# Patient Record
Sex: Male | Born: 1960 | Race: White | Hispanic: No | Marital: Married | Smoking: Never smoker
Health system: Southern US, Community
[De-identification: ages and names within clinical notes are randomized; demographics above are authoritative.]

## PROBLEM LIST (undated history)

## (undated) DIAGNOSIS — I10 Essential (primary) hypertension: Secondary | ICD-10-CM

## (undated) DIAGNOSIS — C801 Malignant (primary) neoplasm, unspecified: Secondary | ICD-10-CM

## (undated) DIAGNOSIS — K219 Gastro-esophageal reflux disease without esophagitis: Secondary | ICD-10-CM

## (undated) DIAGNOSIS — H8109 Meniere's disease, unspecified ear: Secondary | ICD-10-CM

## (undated) DIAGNOSIS — E291 Testicular hypofunction: Secondary | ICD-10-CM

## (undated) HISTORY — DX: Meniere's disease, unspecified ear: H81.09

## (undated) HISTORY — DX: Testicular hypofunction: E29.1

## (undated) HISTORY — DX: Malignant (primary) neoplasm, unspecified: C80.1

## (undated) HISTORY — DX: Essential (primary) hypertension: I10

---

## 2010-04-05 HISTORY — PX: OTHER SURGICAL HISTORY: SHX169

## 2011-01-04 ENCOUNTER — Encounter: Payer: Self-pay | Admitting: Urology

## 2011-02-04 ENCOUNTER — Encounter: Payer: Self-pay | Admitting: Urology

## 2011-04-28 ENCOUNTER — Ambulatory Visit: Payer: Self-pay | Admitting: Orthopedic Surgery

## 2012-04-05 HISTORY — PX: MENISCUS REPAIR: SHX5179

## 2013-07-27 ENCOUNTER — Encounter: Payer: Self-pay | Admitting: Urology

## 2013-07-27 LAB — HEMATOCRIT: HCT: 48.5 % (ref 40.0–52.0)

## 2013-07-27 LAB — HEMOGLOBIN: HGB: 16.8 g/dL (ref 13.0–18.0)

## 2013-08-03 ENCOUNTER — Encounter: Payer: Self-pay | Admitting: Urology

## 2014-11-15 ENCOUNTER — Telehealth: Payer: Self-pay | Admitting: Family Medicine

## 2014-11-15 DIAGNOSIS — I10 Essential (primary) hypertension: Secondary | ICD-10-CM | POA: Insufficient documentation

## 2014-11-15 NOTE — Telephone Encounter (Signed)
Pt came in and would like to have refill for benazepril sent to [facility]

## 2014-11-15 NOTE — Telephone Encounter (Signed)
Pt would like a call back when ready at [phone]

## 2014-11-15 NOTE — Telephone Encounter (Signed)
Confirmed with pharmacy patient has refills on current Rx, patient notified. Patient also told he needs to schedule his 6 mo fu

## 2014-12-26 ENCOUNTER — Encounter: Payer: Self-pay | Admitting: Family Medicine

## 2014-12-26 ENCOUNTER — Ambulatory Visit (INDEPENDENT_AMBULATORY_CARE_PROVIDER_SITE_OTHER): Payer: BLUE CROSS/BLUE SHIELD | Admitting: Family Medicine

## 2014-12-26 VITALS — BP 126/77 | HR 70 | Temp 97.9°F | Ht 69.0 in | Wt 186.0 lb

## 2014-12-26 DIAGNOSIS — I10 Essential (primary) hypertension: Secondary | ICD-10-CM

## 2014-12-26 DIAGNOSIS — E291 Testicular hypofunction: Secondary | ICD-10-CM | POA: Diagnosis not present

## 2014-12-26 NOTE — Assessment & Plan Note (Signed)
The current medical regimen is effective;  continue present plan and medications.  

## 2014-12-26 NOTE — Assessment & Plan Note (Signed)
We'll draw labs for patient's visit

## 2014-12-26 NOTE — Progress Notes (Signed)
   BP 126/77 mmHg  Pulse 70  Temp(Src) 97.9 F (36.6 C)  Ht 5\' 9"  (1.753 m)  Wt 186 lb (84.369 kg)  BMI 27.45 kg/m2  SpO2 99%   Subjective:    Patient ID: Jorge Brown, male    DOB: 1960/11/02, 54 y.o.   MRN: [id number]  HPI: Jorge Brown is a 54 y.o. male  Chief Complaint  Patient presents with  . Hypertension   patient recheck hypertension doing well with medications no side effects takes everyday. Blood pressure is been doing good  also getting testosterone pellets every 4-6 months has blood work pending with urology next week so is ready for urology.  Relevant past medical, surgical, family and social history reviewed and updated as indicated. Interim medical history since our last visit reviewed. Allergies and medications reviewed and updated.  Review of Systems  Constitutional: Negative.   Respiratory: Negative.   Cardiovascular: Negative.     Per HPI unless specifically indicated above     Objective:    BP 126/77 mmHg  Pulse 70  Temp(Src) 97.9 F (36.6 C)  Ht 5\' 9"  (1.753 m)  Wt 186 lb (84.369 kg)  BMI 27.45 kg/m2  SpO2 99%  Wt Readings from Last 3 Encounters:  12/26/14 186 lb (84.369 kg)  06/25/14 186 lb (84.369 kg)    Physical Exam  Constitutional: He is oriented to person, place, and time. He appears well-developed and well-nourished. No distress.  HENT:  Head: Normocephalic and atraumatic.  Right Ear: Hearing normal.  Left Ear: Hearing normal.  Nose: Nose normal.  Eyes: Conjunctivae and lids are normal. Right eye exhibits no discharge. Left eye exhibits no discharge. No scleral icterus.  Cardiovascular: Normal rate, regular rhythm and normal heart sounds.   Pulmonary/Chest: Effort normal and breath sounds normal. No respiratory distress.  Musculoskeletal: Normal range of motion.  Neurological: He is alert and oriented to person, place, and time.  Skin: Skin is intact. No rash noted.  Psychiatric: He has a normal mood and affect. His speech  is normal and behavior is normal. Judgment and thought content normal. Cognition and memory are normal.        Assessment & Plan:   Problem List Items Addressed This Visit      Cardiovascular and Mediastinum   Hypertension    The current medical regimen is effective;  continue present plan and medications.       Relevant Orders   Basic metabolic panel   CBC with Differential/Platelet     Endocrine   Testicular hypofunction    We'll draw labs for patient's visit      Relevant Orders   CBC with Differential/Platelet   PSA   Testosterone    Other Visit Diagnoses    Essential hypertension, benign    -  Primary    Relevant Orders    Basic metabolic panel        Follow up plan: Return in about 6 months (around 06/25/2015), or if symptoms worsen or fail to improve, for Physical Exam.

## 2014-12-27 LAB — CBC WITH DIFFERENTIAL/PLATELET
BASOS ABS: 0 10*3/uL (ref 0.0–0.2)
BASOS: 0 %
EOS (ABSOLUTE): 0.1 10*3/uL (ref 0.0–0.4)
Eos: 1 %
Hematocrit: 45.2 % (ref 37.5–51.0)
Hemoglobin: 15.1 g/dL (ref 12.6–17.7)
Immature Grans (Abs): 0 10*3/uL (ref 0.0–0.1)
Immature Granulocytes: 0 %
LYMPHS ABS: 2.1 10*3/uL (ref 0.7–3.1)
Lymphs: 26 %
MCH: 32.7 pg (ref 26.6–33.0)
MCHC: 33.4 g/dL (ref 31.5–35.7)
MCV: 98 fL — ABNORMAL HIGH (ref 79–97)
MONOCYTES: 8 %
Monocytes Absolute: 0.6 10*3/uL (ref 0.1–0.9)
NEUTROS ABS: 5.2 10*3/uL (ref 1.4–7.0)
Neutrophils: 65 %
Platelets: 227 10*3/uL (ref 150–379)
RBC: 4.62 x10E6/uL (ref 4.14–5.80)
RDW: 13.4 % (ref 12.3–15.4)
WBC: 8.1 10*3/uL (ref 3.4–10.8)

## 2014-12-27 LAB — TESTOSTERONE: Testosterone: 250 ng/dL — ABNORMAL LOW (ref 348–1197)

## 2014-12-27 LAB — BASIC METABOLIC PANEL
BUN / CREAT RATIO: 17 (ref 9–20)
BUN: 17 mg/dL (ref 6–24)
CALCIUM: 9.5 mg/dL (ref 8.7–10.2)
CHLORIDE: 103 mmol/L (ref 97–108)
CO2: 25 mmol/L (ref 18–29)
Creatinine, Ser: 1 mg/dL (ref 0.76–1.27)
GFR, EST AFRICAN AMERICAN: 100 mL/min/{1.73_m2} (ref >59)
GFR, EST NON AFRICAN AMERICAN: 86 mL/min/{1.73_m2} (ref >59)
Glucose: 75 mg/dL (ref 65–99)
POTASSIUM: 4.3 mmol/L (ref 3.5–5.2)
Sodium: 142 mmol/L (ref 134–144)

## 2014-12-27 LAB — PSA: Prostate Specific Ag, Serum: 0.6 ng/mL (ref 0.0–4.0)

## 2014-12-30 ENCOUNTER — Encounter: Payer: Self-pay | Admitting: Family Medicine

## 2015-03-13 ENCOUNTER — Encounter: Payer: Self-pay | Admitting: Unknown Physician Specialty

## 2015-03-25 ENCOUNTER — Other Ambulatory Visit: Payer: Self-pay | Admitting: Otolaryngology

## 2015-03-25 DIAGNOSIS — R42 Dizziness and giddiness: Secondary | ICD-10-CM

## 2015-03-26 ENCOUNTER — Telehealth: Payer: Self-pay | Admitting: Family Medicine

## 2015-03-26 NOTE — Telephone Encounter (Signed)
Ok to start [employer]

## 2015-03-26 NOTE — Telephone Encounter (Signed)
Beth from [facility] called stated Dr. Pryor Ochoa would like to start pt on HCTZ because of his meniere's but wants to make sure this is ok with [facility] first. Please call Beth @ [facility] asap. Thanks.

## 2015-03-26 NOTE — Telephone Encounter (Signed)
Called and left msg for [place], [place] from [facility] for patient to start HCTZ

## 2015-04-01 ENCOUNTER — Encounter: Payer: BLUE CROSS/BLUE SHIELD | Admitting: Unknown Physician Specialty

## 2015-04-03 ENCOUNTER — Encounter: Payer: Self-pay | Admitting: Unknown Physician Specialty

## 2015-04-03 ENCOUNTER — Ambulatory Visit (INDEPENDENT_AMBULATORY_CARE_PROVIDER_SITE_OTHER): Payer: Self-pay | Admitting: Unknown Physician Specialty

## 2015-04-03 VITALS — BP 126/85 | HR 73 | Temp 98.4°F | Ht 69.1 in | Wt 194.6 lb

## 2015-04-03 DIAGNOSIS — Z021 Encounter for pre-employment examination: Secondary | ICD-10-CM

## 2015-04-03 DIAGNOSIS — Z0000 Encounter for general adult medical examination without abnormal findings: Secondary | ICD-10-CM

## 2015-04-03 LAB — URINALYSIS, DIPSTICK ONLY
Bilirubin, UA: NEGATIVE
Glucose, UA: NEGATIVE
KETONES UA: NEGATIVE
Leukocytes, UA: NEGATIVE
NITRITE UA: NEGATIVE
Protein, UA: NEGATIVE
RBC, UA: NEGATIVE
Specific Gravity, UA: 1.01 (ref 1.005–1.030)
UUROB: 0.2 mg/dL (ref 0.2–1.0)
pH, UA: 7 (ref 5.0–7.5)

## 2015-04-03 NOTE — Progress Notes (Signed)
BP 126/85 mmHg  Pulse 73  Temp(Src) 98.4 F (36.9 C)  Ht 5' 9.1" (1.755 m)  Wt 194 lb 9.6 oz (88.27 kg)  BMI 28.66 kg/m2  SpO2 97%   Subjective:    Patient ID: Jorge Brown, male    DOB: 01-15-1961, 54 y.o.   MRN: [id number]  HPI: [name] is a 54 y.o. male  Chief Complaint  Patient presents with  . Employment Physical   DOT physical  Relevant past medical, surgical, family and social history reviewed and updated as indicated. Interim medical history since our last visit reviewed. Allergies and medications reviewed and updated.  Review of Systems  Constitutional: Negative.   HENT: Negative.   Eyes: Negative.   Respiratory: Negative.   Cardiovascular: Negative.   Gastrointestinal: Negative.   Endocrine: Negative.   Genitourinary: Negative.   Skin: Negative.   Allergic/Immunologic: Negative.   Neurological: Negative.   Hematological: Negative.   Psychiatric/Behavioral: Negative.     Per HPI unless specifically indicated above     Objective:    BP 126/85 mmHg  Pulse 73  Temp(Src) 98.4 F (36.9 C)  Ht 5' 9.1" (1.755 m)  Wt 194 lb 9.6 oz (88.27 kg)  BMI 28.66 kg/m2  SpO2 97%  Wt Readings from Last 3 Encounters:  04/03/15 194 lb 9.6 oz (88.27 kg)  12/26/14 186 lb (84.369 kg)  06/25/14 186 lb (84.369 kg)    Physical Exam  Constitutional: He is oriented to person, place, and time. He appears well-developed and well-nourished.  HENT:  Head: Normocephalic.  Right Ear: Tympanic membrane, external ear and ear canal normal.  Left Ear: Tympanic membrane, external ear and ear canal normal.  Mouth/Throat: Uvula is midline, oropharynx is clear and moist and mucous membranes are normal.  Eyes: Pupils are equal, round, and reactive to light.  Cardiovascular: Normal rate, regular rhythm and normal heart sounds.  Exam reveals no gallop and no friction rub.   No murmur heard. Pulmonary/Chest: Effort normal and breath sounds normal. No respiratory distress.   Abdominal: Soft. Bowel sounds are normal. He exhibits no distension. There is no tenderness.  Musculoskeletal: Normal range of motion.  Neurological: He is alert and oriented to person, place, and time. He has normal reflexes.  Skin: Skin is warm and dry.  Psychiatric: He has a normal mood and affect. His behavior is normal. Judgment and thought content normal.    Results for orders placed or performed in visit on [id number]  Basic metabolic panel  Result Value Ref Range   Glucose 75 65 - 99 mg/dL   BUN 17 6 - 24 mg/dL   Creatinine, Ser 1.00 0.76 - 1.27 mg/dL   GFR calc non Af Amer 86 >59 mL/min/1.73   GFR calc Af Amer 100 >59 mL/min/1.73   BUN/Creatinine Ratio 17 9 - 20   Sodium 142 134 - 144 mmol/L   Potassium 4.3 3.5 - 5.2 mmol/L   Chloride 103 97 - 108 mmol/L   CO2 25 18 - 29 mmol/L   Calcium 9.5 8.7 - 10.2 mg/dL  CBC with Differential/Platelet  Result Value Ref Range   WBC 8.1 3.4 - 10.8 x10E3/uL   RBC 4.62 4.14 - 5.80 x10E6/uL   Hemoglobin 15.1 12.6 - 17.7 g/dL   Hematocrit 45.2 37.5 - 51.0 %   MCV 98 (H) 79 - 97 fL   MCH 32.7 26.6 - 33.0 pg   MCHC 33.4 31.5 - 35.7 g/dL   RDW 13.4 12.3 - 15.4 %  Platelets 227 150 - 379 x10E3/uL   Neutrophils 65 %   Lymphs 26 %   Monocytes 8 %   Eos 1 %   Basos 0 %   Neutrophils Absolute 5.2 1.4 - 7.0 x10E3/uL   Lymphocytes Absolute 2.1 0.7 - 3.1 x10E3/uL   Monocytes Absolute 0.6 0.1 - 0.9 x10E3/uL   EOS (ABSOLUTE) 0.1 0.0 - 0.4 x10E3/uL   Basophils Absolute 0.0 0.0 - 0.2 x10E3/uL   Immature Granulocytes 0 %   Immature Grans (Abs) 0.0 0.0 - 0.1 x10E3/uL  PSA  Result Value Ref Range   Prostate Specific Ag, Serum 0.6 0.0 - 4.0 ng/mL  Testosterone  Result Value Ref Range   Testosterone 250 (L) 348 - 1197 ng/dL   Comment, Testosterone Comment       Assessment & Plan:   Problem List Items Addressed This Visit    None    Visit Diagnoses    Routine general medical examination at a health care facility    -  Primary     Relevant Orders    Urinalysis, dipstick only        Follow up plan: No Follow-up on file.

## 2015-04-16 ENCOUNTER — Ambulatory Visit: Payer: Self-pay

## 2015-05-05 ENCOUNTER — Ambulatory Visit: Payer: BLUE CROSS/BLUE SHIELD

## 2015-06-11 ENCOUNTER — Other Ambulatory Visit: Payer: Self-pay

## 2015-06-11 MED ORDER — BENAZEPRIL HCL 40 MG PO TABS: 40.0000 mg | ORAL_TABLET | Freq: Every day | ORAL | Status: DC

## 2015-06-13 ENCOUNTER — Telehealth: Payer: Self-pay | Admitting: Family Medicine

## 2015-06-13 NOTE — Telephone Encounter (Signed)
This was done on 06/11/15

## 2015-06-13 NOTE — Telephone Encounter (Signed)
Pt needs refill for benazepril (LOTENSIN) 40 MG tablet sent to [facility]

## 2015-06-26 ENCOUNTER — Encounter: Payer: BLUE CROSS/BLUE SHIELD | Admitting: Family Medicine

## 2015-07-15 ENCOUNTER — Telehealth: Payer: Self-pay | Admitting: Family Medicine

## 2015-07-15 MED ORDER — BENAZEPRIL HCL 40 MG PO TABS: 40.0000 mg | ORAL_TABLET | Freq: Every day | ORAL | Status: DC

## 2015-07-15 NOTE — Telephone Encounter (Signed)
Patient need refill on his medication due to we had to reschedule his medication because Dr. Jeananne Rama was out sick. He needs refill until his next appointment, RX benazepril (LOTENSIN) 40 MG tablet Pharmacy: [facility], [place]

## 2015-08-28 ENCOUNTER — Ambulatory Visit (INDEPENDENT_AMBULATORY_CARE_PROVIDER_SITE_OTHER): Payer: BLUE CROSS/BLUE SHIELD | Admitting: Family Medicine

## 2015-08-28 ENCOUNTER — Encounter: Payer: Self-pay | Admitting: Family Medicine

## 2015-08-28 VITALS — BP 122/83 | HR 80 | Temp 98.0°F | Ht 69.5 in | Wt 183.0 lb

## 2015-08-28 DIAGNOSIS — E291 Testicular hypofunction: Secondary | ICD-10-CM

## 2015-08-28 DIAGNOSIS — I10 Essential (primary) hypertension: Secondary | ICD-10-CM | POA: Diagnosis not present

## 2015-08-28 DIAGNOSIS — Z0000 Encounter for general adult medical examination without abnormal findings: Secondary | ICD-10-CM

## 2015-08-28 LAB — URINALYSIS, ROUTINE W REFLEX MICROSCOPIC
BILIRUBIN UA: NEGATIVE
Glucose, UA: NEGATIVE
Ketones, UA: NEGATIVE
LEUKOCYTES UA: NEGATIVE
NITRITE UA: NEGATIVE
PH UA: 5.5 (ref 5.0–7.5)
Protein, UA: NEGATIVE
RBC UA: NEGATIVE
UUROB: 0.2 mg/dL (ref 0.2–1.0)

## 2015-08-28 MED ORDER — HYDROCHLOROTHIAZIDE 25 MG PO TABS: 25.0000 mg | ORAL_TABLET | Freq: Every day | ORAL | Status: DC

## 2015-08-28 MED ORDER — BENAZEPRIL HCL 40 MG PO TABS: 40.0000 mg | ORAL_TABLET | Freq: Every day | ORAL | Status: DC

## 2015-08-28 NOTE — Progress Notes (Signed)
BP 122/83 mmHg  Pulse 80  Temp(Src) 98 F (36.7 C)  Ht 5' 9.5" (1.765 m)  Wt 183 lb (83.008 kg)  BMI 26.65 kg/m2  SpO2 98%   Subjective:    Patient ID: Jorge Brown, male    DOB: 29-Jan-1961, 55 y.o.   MRN: [id number]  HPI: Jorge Brown is a 55 y.o. male  Chief Complaint  Patient presents with  . Annual Exam  Patient all in all doing well blood pressure doing good no complaints from medications no side effects   Relevant past medical, surgical, family and social history reviewed and updated as indicated. Interim medical history since our last visit reviewed. Allergies and medications reviewed and updated.  Review of Systems  Constitutional: Negative.   HENT: Negative.   Eyes: Negative.   Respiratory: Negative.   Cardiovascular: Negative.   Gastrointestinal: Negative.   Endocrine: Negative.   Genitourinary: Negative.   Musculoskeletal: Negative.   Skin: Negative.   Allergic/Immunologic: Negative.   Neurological: Negative.   Hematological: Negative.   Psychiatric/Behavioral: Negative.     Per HPI unless specifically indicated above     Objective:    BP 122/83 mmHg  Pulse 80  Temp(Src) 98 F (36.7 C)  Ht 5' 9.5" (1.765 m)  Wt 183 lb (83.008 kg)  BMI 26.65 kg/m2  SpO2 98%  Wt Readings from Last 3 Encounters:  08/28/15 183 lb (83.008 kg)  04/03/15 194 lb 9.6 oz (88.27 kg)  12/26/14 186 lb (84.369 kg)    Physical Exam  Constitutional: He is oriented to person, place, and time. He appears well-developed and well-nourished.  HENT:  Head: Normocephalic and atraumatic.  Right Ear: External ear normal.  Left Ear: External ear normal.  Eyes: Conjunctivae and EOM are normal. Pupils are equal, round, and reactive to light.  Neck: Normal range of motion. Neck supple.  Cardiovascular: Normal rate, regular rhythm, normal heart sounds and intact distal pulses.   Pulmonary/Chest: Effort normal and breath sounds normal.  Abdominal: Soft. Bowel sounds are  normal. There is no splenomegaly or hepatomegaly.  Genitourinary: Rectum normal, prostate normal and penis normal.  Musculoskeletal: Normal range of motion.  Neurological: He is alert and oriented to person, place, and time. He has normal reflexes.  Skin: No rash noted. No erythema.  Psychiatric: He has a normal mood and affect. His behavior is normal. Judgment and thought content normal.    Results for orders placed or performed in visit on 04/03/15  Urinalysis, dipstick only  Result Value Ref Range   Specific Gravity, UA 1.010 1.005 - 1.030   pH, UA 7.0 5.0 - 7.5   Color, UA Yellow Yellow   Appearance Ur Clear Clear   Leukocytes, UA Negative Negative   Protein, UA Negative Negative/Trace   Glucose, UA Negative Negative   Ketones, UA Negative Negative   RBC, UA Negative Negative   Bilirubin, UA Negative Negative   Urobilinogen, Ur 0.2 0.2 - 1.0 mg/dL   Nitrite, UA Negative Negative      Assessment & Plan:   Problem List Items Addressed This Visit      Cardiovascular and Mediastinum   Hypertension    The current medical regimen is effective;  continue present plan and medications.       Relevant Medications   benazepril (LOTENSIN) 40 MG tablet   hydrochlorothiazide (HYDRODIURIL) 25 MG tablet     Endocrine   Testicular hypofunction    Followed by urology       Other  Visit Diagnoses    Routine general medical examination at a health care facility    -  Primary    Relevant Orders    CBC with Differential/Platelet    Comprehensive metabolic panel    Lipid Panel w/o Chol/HDL Ratio    PSA    TSH    Urinalysis, Routine w reflex microscopic (not at [facility])    Healthcare maintenance        Relevant Orders    Hepatitis C Antibody        Follow up plan: Return in about 6 months (around 02/28/2016) for BMP.

## 2015-08-28 NOTE — Assessment & Plan Note (Signed)
Followed by urology.   

## 2015-08-28 NOTE — Assessment & Plan Note (Signed)
The current medical regimen is effective;  continue present plan and medications.  

## 2015-08-28 NOTE — Addendum Note (Signed)
Addended by: Wynn Maudlin on: 08/28/2015 03:52 PM   Modules accepted: Miquel Dunn

## 2015-08-29 LAB — COMPREHENSIVE METABOLIC PANEL
ALT: 20 IU/L (ref 0–44)
AST: 17 IU/L (ref 0–40)
Albumin/Globulin Ratio: 1.9 (ref 1.2–2.2)
Albumin: 4.5 g/dL (ref 3.5–5.5)
Alkaline Phosphatase: 61 IU/L (ref 39–117)
BILIRUBIN TOTAL: 0.7 mg/dL (ref 0.0–1.2)
BUN/Creatinine Ratio: 20 (ref 9–20)
BUN: 20 mg/dL (ref 6–24)
CALCIUM: 9.2 mg/dL (ref 8.7–10.2)
CHLORIDE: 98 mmol/L (ref 96–106)
CO2: 23 mmol/L (ref 18–29)
CREATININE: 0.99 mg/dL (ref 0.76–1.27)
GFR calc non Af Amer: 87 mL/min/{1.73_m2} (ref >59)
GFR, EST AFRICAN AMERICAN: 101 mL/min/{1.73_m2} (ref >59)
GLUCOSE: 97 mg/dL (ref 65–99)
Globulin, Total: 2.4 g/dL (ref 1.5–4.5)
Potassium: 4 mmol/L (ref 3.5–5.2)
Sodium: 140 mmol/L (ref 134–144)
TOTAL PROTEIN: 6.9 g/dL (ref 6.0–8.5)

## 2015-08-29 LAB — LIPID PANEL W/O CHOL/HDL RATIO
Cholesterol, Total: 202 mg/dL — ABNORMAL HIGH (ref 100–199)
HDL: 55 mg/dL (ref >39)
LDL CALC: 115 mg/dL — AB (ref 0–99)
Triglycerides: 158 mg/dL — ABNORMAL HIGH (ref 0–149)
VLDL CHOLESTEROL CAL: 32 mg/dL (ref 5–40)

## 2015-08-29 LAB — CBC WITH DIFFERENTIAL/PLATELET
Basophils Absolute: 0 10*3/uL (ref 0.0–0.2)
Basos: 0 %
EOS (ABSOLUTE): 0.1 10*3/uL (ref 0.0–0.4)
Eos: 1 %
HEMOGLOBIN: 15.3 g/dL (ref 12.6–17.7)
Hematocrit: 45.7 % (ref 37.5–51.0)
IMMATURE GRANS (ABS): 0 10*3/uL (ref 0.0–0.1)
IMMATURE GRANULOCYTES: 0 %
LYMPHS: 32 %
Lymphocytes Absolute: 2.4 10*3/uL (ref 0.7–3.1)
MCH: 32.3 pg (ref 26.6–33.0)
MCHC: 33.5 g/dL (ref 31.5–35.7)
MCV: 96 fL (ref 79–97)
MONOCYTES: 7 %
Monocytes Absolute: 0.6 10*3/uL (ref 0.1–0.9)
NEUTROS PCT: 60 %
Neutrophils Absolute: 4.5 10*3/uL (ref 1.4–7.0)
PLATELETS: 260 10*3/uL (ref 150–379)
RBC: 4.74 x10E6/uL (ref 4.14–5.80)
RDW: 13.7 % (ref 12.3–15.4)
WBC: 7.6 10*3/uL (ref 3.4–10.8)

## 2015-08-29 LAB — PSA: Prostate Specific Ag, Serum: 0.6 ng/mL (ref 0.0–4.0)

## 2015-08-29 LAB — HEPATITIS C ANTIBODY: Hep C Virus Ab: <0.1 s/co ratio (ref 0.0–0.9)

## 2015-08-29 LAB — TSH: TSH: 0.848 u[IU]/mL (ref 0.450–4.500)

## 2015-09-02 ENCOUNTER — Encounter: Payer: Self-pay | Admitting: Family Medicine

## 2015-10-17 ENCOUNTER — Ambulatory Visit (INDEPENDENT_AMBULATORY_CARE_PROVIDER_SITE_OTHER): Payer: BLUE CROSS/BLUE SHIELD | Admitting: Family Medicine

## 2015-10-17 ENCOUNTER — Encounter: Payer: Self-pay | Admitting: Family Medicine

## 2015-10-17 VITALS — BP 146/87 | HR 59 | Temp 98.5°F | Wt 192.0 lb

## 2015-10-17 DIAGNOSIS — R197 Diarrhea, unspecified: Secondary | ICD-10-CM

## 2015-10-17 NOTE — Patient Instructions (Signed)
Follow up if no improvement 

## 2015-10-17 NOTE — Progress Notes (Signed)
   BP 146/87 mmHg  Pulse 59  Temp(Src) 98.5 F (36.9 C)  Wt 192 lb (87.091 kg)  SpO2 99%   Subjective:    Patient ID: Jorge Brown, male    DOB: 30-Nov-1960, 55 y.o.   MRN: [id number]  HPI: TAHJAY Jorge Brown is a 55 y.o. male  Chief Complaint  Patient presents with  . Diarrhea    since Brown but worse in the last couple days. Stomach feels bloated. He tried Immodium yesterday and it did not help. No abdominal pain,nausea or vomiting.   Patient presents with 4 day history of diarrhea. Takes HCTZ regularly so stopped taking it this week worrying about dehydration (he works outside in the heat all day). Took immodium several times with no relief. No new foods, no sick contacts. No fever, chills, abdominal pain, N/V.  Has been drinking lots of electrolyte replacements.  BMs have been urgent, loose but not watery and happening about 30 min following eating anything. About 4 episodes per day. Denies blood or mucus in stools.   Relevant past medical, surgical, family and social history reviewed and updated as indicated. Interim medical history since our last visit reviewed. Allergies and medications reviewed and updated.  Review of Systems  Constitutional: Negative.  Negative for fever and chills.  HENT: Negative.   Respiratory: Negative.   Cardiovascular: Negative.   Gastrointestinal: Positive for diarrhea. Negative for nausea, vomiting, abdominal pain and blood in stool.  Genitourinary: Negative.  Negative for dysuria, frequency, hematuria and flank pain.  Musculoskeletal: Negative.   Skin: Negative.   Neurological: Negative.   Psychiatric/Behavioral: Negative.     Per HPI unless specifically indicated above     Objective:    BP 146/87 mmHg  Pulse 59  Temp(Src) 98.5 F (36.9 C)  Wt 192 lb (87.091 kg)  SpO2 99%  Wt Readings from Last 3 Encounters:  10/17/15 192 lb (87.091 kg)  08/28/15 183 lb (83.008 kg)  04/03/15 194 lb 9.6 oz (88.27 kg)    Physical Exam    Constitutional: He is oriented to person, place, and time. He appears well-developed and well-nourished. No distress.  HENT:  Head: Atraumatic.  Eyes: Conjunctivae are normal. No scleral icterus.  Neck: Normal range of motion. Neck supple.  Cardiovascular: Normal rate.   Pulmonary/Chest: Effort normal and breath sounds normal. No respiratory distress.  Abdominal: Soft. Bowel sounds are normal. He exhibits no distension and no mass. There is no tenderness. There is no guarding.  Musculoskeletal: Normal range of motion.  Neurological: He is alert and oriented to person, place, and time.  Skin: Skin is warm and dry.  Psychiatric: He has a normal mood and affect. His behavior is normal.  Nursing note and vitals reviewed.       Assessment & Plan:   Problem List Items Addressed This Visit    None    Visit Diagnoses    Diarrhea, unspecified type    -  Primary    Vitals and exam reassuring, await lab results. Recommended conservative treatment for now with immodium and pepto bismol, good PO fluid intake.     Relevant Orders    Comp Met (CMET)    CBC w/Diff        Follow up plan: No Follow-up on file.

## 2015-10-18 LAB — CBC WITH DIFFERENTIAL/PLATELET
BASOS ABS: 0 10*3/uL (ref 0.0–0.2)
Basos: 0 %
EOS (ABSOLUTE): 0.1 10*3/uL (ref 0.0–0.4)
Eos: 1 %
Hematocrit: 45.3 % (ref 37.5–51.0)
Hemoglobin: 15.4 g/dL (ref 12.6–17.7)
IMMATURE GRANS (ABS): 0 10*3/uL (ref 0.0–0.1)
Immature Granulocytes: 0 %
LYMPHS: 28 %
Lymphocytes Absolute: 2.5 10*3/uL (ref 0.7–3.1)
MCH: 32.6 pg (ref 26.6–33.0)
MCHC: 34 g/dL (ref 31.5–35.7)
MCV: 96 fL (ref 79–97)
MONOS ABS: 0.6 10*3/uL (ref 0.1–0.9)
Monocytes: 7 %
NEUTROS ABS: 5.8 10*3/uL (ref 1.4–7.0)
Neutrophils: 64 %
PLATELETS: 219 10*3/uL (ref 150–379)
RBC: 4.72 x10E6/uL (ref 4.14–5.80)
RDW: 13.3 % (ref 12.3–15.4)
WBC: 8.9 10*3/uL (ref 3.4–10.8)

## 2015-10-18 LAB — COMPREHENSIVE METABOLIC PANEL
ALK PHOS: 58 IU/L (ref 39–117)
ALT: 22 IU/L (ref 0–44)
AST: 18 IU/L (ref 0–40)
Albumin/Globulin Ratio: 1.8 (ref 1.2–2.2)
Albumin: 4.2 g/dL (ref 3.5–5.5)
BILIRUBIN TOTAL: 0.7 mg/dL (ref 0.0–1.2)
BUN/Creatinine Ratio: 21 — ABNORMAL HIGH (ref 9–20)
BUN: 20 mg/dL (ref 6–24)
CO2: 25 mmol/L (ref 18–29)
CREATININE: 0.96 mg/dL (ref 0.76–1.27)
Calcium: 9.6 mg/dL (ref 8.7–10.2)
Chloride: 101 mmol/L (ref 96–106)
GFR calc Af Amer: 104 mL/min/{1.73_m2} (ref >59)
GFR, EST NON AFRICAN AMERICAN: 90 mL/min/{1.73_m2} (ref >59)
GLUCOSE: 87 mg/dL (ref 65–99)
Globulin, Total: 2.4 g/dL (ref 1.5–4.5)
Potassium: 4.2 mmol/L (ref 3.5–5.2)
SODIUM: 142 mmol/L (ref 134–144)
Total Protein: 6.6 g/dL (ref 6.0–8.5)

## 2015-10-20 ENCOUNTER — Encounter: Payer: Self-pay | Admitting: Family Medicine

## 2016-03-02 ENCOUNTER — Ambulatory Visit: Payer: Self-pay | Admitting: Family Medicine

## 2016-03-08 ENCOUNTER — Encounter: Payer: Self-pay | Admitting: Family Medicine

## 2016-03-08 ENCOUNTER — Ambulatory Visit (INDEPENDENT_AMBULATORY_CARE_PROVIDER_SITE_OTHER): Payer: PRIVATE HEALTH INSURANCE | Admitting: Family Medicine

## 2016-03-08 VITALS — BP 136/86 | HR 63 | Temp 97.8°F | Ht 69.8 in | Wt 194.6 lb

## 2016-03-08 DIAGNOSIS — K219 Gastro-esophageal reflux disease without esophagitis: Secondary | ICD-10-CM

## 2016-03-08 DIAGNOSIS — I10 Essential (primary) hypertension: Secondary | ICD-10-CM | POA: Diagnosis not present

## 2016-03-08 MED ORDER — DEXLANSOPRAZOLE 60 MG PO CPDR: 60.0000 mg | DELAYED_RELEASE_CAPSULE | Freq: Every day | ORAL | 11 refills | Status: DC

## 2016-03-08 NOTE — Assessment & Plan Note (Signed)
Trial of dexilant  

## 2016-03-08 NOTE — Progress Notes (Signed)
BP 136/86 (BP Location: Left Arm)   Pulse 63   Temp 97.8 F (36.6 C)   Ht 5' 9.8" (1.773 m)   Wt 194 lb 9.6 oz (88.3 kg)   SpO2 99%   BMI 28.08 kg/m    Subjective:    Patient ID: Jorge Brown, male    DOB: 03-19-1961, 55 y.o.   MRN: [id number]  HPI: Jorge Brown is a 55 y.o. male  Chief Complaint  Patient presents with  . Hypertension   Doing well with blood pressure no complaints taking medications faithfully Has also been taking Prilosec hasn't doing as well for reflux wants something more Relevant past medical, surgical, family and social history reviewed and updated as indicated. Interim medical history since our last visit reviewed. Allergies and medications reviewed and updated.  Review of Systems  Constitutional: Negative.   Respiratory: Negative.   Cardiovascular: Negative.     Per HPI unless specifically indicated above     Objective:    BP 136/86 (BP Location: Left Arm)   Pulse 63   Temp 97.8 F (36.6 C)   Ht 5' 9.8" (1.773 m)   Wt 194 lb 9.6 oz (88.3 kg)   SpO2 99%   BMI 28.08 kg/m   Wt Readings from Last 3 Encounters:  03/08/16 194 lb 9.6 oz (88.3 kg)  10/17/15 192 lb (87.1 kg)  08/28/15 183 lb (83 kg)    Physical Exam  Constitutional: He is oriented to person, place, and time. He appears well-developed and well-nourished. No distress.  HENT:  Head: Normocephalic and atraumatic.  Right Ear: Hearing normal.  Left Ear: Hearing normal.  Nose: Nose normal.  Eyes: Conjunctivae and lids are normal. Right eye exhibits no discharge. Left eye exhibits no discharge. No scleral icterus.  Cardiovascular: Normal rate, regular rhythm and normal heart sounds.   Pulmonary/Chest: Effort normal and breath sounds normal. No respiratory distress.  Musculoskeletal: Normal range of motion.  Neurological: He is alert and oriented to person, place, and time.  Skin: Skin is intact. No rash noted.  Psychiatric: He has a normal mood and affect. His speech is  normal and behavior is normal. Judgment and thought content normal. Cognition and memory are normal.    Results for orders placed or performed in visit on 10/17/15  Comp Met (CMET)  Result Value Ref Range   Glucose 87 65 - 99 mg/dL   BUN 20 6 - 24 mg/dL   Creatinine, Ser 0.96 0.76 - 1.27 mg/dL   GFR calc non Af Amer 90 >59 mL/min/1.73   GFR calc Af Amer 104 >59 mL/min/1.73   BUN/Creatinine Ratio 21 (H) 9 - 20   Sodium 142 134 - 144 mmol/L   Potassium 4.2 3.5 - 5.2 mmol/L   Chloride 101 96 - 106 mmol/L   CO2 25 18 - 29 mmol/L   Calcium 9.6 8.7 - 10.2 mg/dL   Total Protein 6.6 6.0 - 8.5 g/dL   Albumin 4.2 3.5 - 5.5 g/dL   Globulin, Total 2.4 1.5 - 4.5 g/dL   Albumin/Globulin Ratio 1.8 1.2 - 2.2   Bilirubin Total 0.7 0.0 - 1.2 mg/dL   Alkaline Phosphatase 58 39 - 117 IU/L   AST 18 0 - 40 IU/L   ALT 22 0 - 44 IU/L  CBC w/Diff  Result Value Ref Range   WBC 8.9 3.4 - 10.8 x10E3/uL   RBC 4.72 4.14 - 5.80 x10E6/uL   Hemoglobin 15.4 12.6 - 17.7 g/dL   Hematocrit  45.3 37.5 - 51.0 %   MCV 96 79 - 97 fL   MCH 32.6 26.6 - 33.0 pg   MCHC 34.0 31.5 - 35.7 g/dL   RDW 13.3 12.3 - 15.4 %   Platelets 219 150 - 379 x10E3/uL   Neutrophils 64 %   Lymphs 28 %   Monocytes 7 %   Eos 1 %   Basos 0 %   Neutrophils Absolute 5.8 1.4 - 7.0 x10E3/uL   Lymphocytes Absolute 2.5 0.7 - 3.1 x10E3/uL   Monocytes Absolute 0.6 0.1 - 0.9 x10E3/uL   EOS (ABSOLUTE) 0.1 0.0 - 0.4 x10E3/uL   Basophils Absolute 0.0 0.0 - 0.2 x10E3/uL   Immature Granulocytes 0 %   Immature Grans (Abs) 0.0 0.0 - 0.1 x10E3/uL      Assessment & Plan:   Problem List Items Addressed This Visit      Cardiovascular and Mediastinum   Hypertension - Primary    The current medical regimen is effective;  continue present plan and medications.       Relevant Orders   Basic metabolic panel     Digestive   Acid reflux    Trial of dexilant      Relevant Medications   dexlansoprazole (DEXILANT) 60 MG capsule       Follow  up plan: Return in about 6 months (around 09/06/2016) for Physical Exam.

## 2016-03-08 NOTE — Assessment & Plan Note (Signed)
The current medical regimen is effective;  continue present plan and medications.  

## 2016-03-09 ENCOUNTER — Encounter: Payer: Self-pay | Admitting: Family Medicine

## 2016-03-09 LAB — BASIC METABOLIC PANEL
BUN/Creatinine Ratio: 15 (ref 9–20)
BUN: 16 mg/dL (ref 6–24)
CO2: 26 mmol/L (ref 18–29)
CREATININE: 1.07 mg/dL (ref 0.76–1.27)
Calcium: 9.7 mg/dL (ref 8.7–10.2)
Chloride: 97 mmol/L (ref 96–106)
GFR, EST AFRICAN AMERICAN: 91 mL/min/{1.73_m2} (ref >59)
GFR, EST NON AFRICAN AMERICAN: 79 mL/min/{1.73_m2} (ref >59)
Glucose: 84 mg/dL (ref 65–99)
POTASSIUM: 4.2 mmol/L (ref 3.5–5.2)
SODIUM: 139 mmol/L (ref 134–144)

## 2016-03-12 ENCOUNTER — Encounter: Payer: Self-pay | Admitting: Unknown Physician Specialty

## 2016-03-15 ENCOUNTER — Telehealth: Payer: Self-pay

## 2016-03-15 MED ORDER — OMEPRAZOLE 20 MG PO CPDR: 20.0000 mg | DELAYED_RELEASE_CAPSULE | Freq: Every day | ORAL | 4 refills | Status: DC

## 2016-03-15 NOTE — Telephone Encounter (Signed)
Request to change Dexilant 60mg  capsule.   Insurance won't cover it unless patient has tried and failed generic versions (omeprazole, etc)

## 2016-03-22 ENCOUNTER — Ambulatory Visit (INDEPENDENT_AMBULATORY_CARE_PROVIDER_SITE_OTHER): Payer: Self-pay | Admitting: Unknown Physician Specialty

## 2016-03-22 ENCOUNTER — Encounter: Payer: Self-pay | Admitting: Unknown Physician Specialty

## 2016-03-22 VITALS — BP 124/77 | HR 69 | Temp 97.9°F | Ht 69.2 in | Wt 193.8 lb

## 2016-03-22 DIAGNOSIS — Z021 Encounter for pre-employment examination: Secondary | ICD-10-CM

## 2016-03-22 DIAGNOSIS — Z0289 Encounter for other administrative examinations: Secondary | ICD-10-CM

## 2016-03-22 LAB — URINALYSIS, DIPSTICK ONLY
Bilirubin, UA: NEGATIVE
Glucose, UA: NEGATIVE
Ketones, UA: NEGATIVE
LEUKOCYTES UA: NEGATIVE
NITRITE UA: NEGATIVE
PH UA: 7 (ref 5.0–7.5)
Protein, UA: NEGATIVE
RBC, UA: NEGATIVE
Specific Gravity, UA: 1.01 (ref 1.005–1.030)
UUROB: 0.2 mg/dL (ref 0.2–1.0)

## 2016-03-22 NOTE — Progress Notes (Signed)
   BP 124/77 (BP Location: Left Arm, Patient Position: Sitting, Cuff Size: Large)   Pulse 69   Temp 97.9 F (36.6 C)   Ht 5' 9.2" (1.758 m)   Wt 193 lb 12.8 oz (87.9 kg)   SpO2 97%   BMI 28.45 kg/m    Subjective:    Patient ID: Jorge Brown, male    DOB: 1961-03-04, 55 y.o.   MRN: [id number]  HPI: Jorge Brown is a 55 y.o. male  Chief Complaint  Patient presents with  . DOT Physical    Relevant past medical, surgical, family and social history reviewed and updated as indicated. Interim medical history since our last visit reviewed. Allergies and medications reviewed and updated.  Review of Systems  Per HPI unless specifically indicated above     Objective:    BP 124/77 (BP Location: Left Arm, Patient Position: Sitting, Cuff Size: Large)   Pulse 69   Temp 97.9 F (36.6 C)   Ht 5' 9.2" (1.758 m)   Wt 193 lb 12.8 oz (87.9 kg)   SpO2 97%   BMI 28.45 kg/m   Wt Readings from Last 3 Encounters:  03/22/16 193 lb 12.8 oz (87.9 kg)  03/08/16 194 lb 9.6 oz (88.3 kg)  10/17/15 192 lb (87.1 kg)    Physical Exam  Results for orders placed or performed in visit on [id number]  Basic metabolic panel  Result Value Ref Range   Glucose 84 65 - 99 mg/dL   BUN 16 6 - 24 mg/dL   Creatinine, Ser 1.07 0.76 - 1.27 mg/dL   GFR calc non Af Amer 79 >59 mL/min/1.73   GFR calc Af Amer 91 >59 mL/min/1.73   BUN/Creatinine Ratio 15 9 - 20   Sodium 139 134 - 144 mmol/L   Potassium 4.2 3.5 - 5.2 mmol/L   Chloride 97 96 - 106 mmol/L   CO2 26 18 - 29 mmol/L   Calcium 9.7 8.7 - 10.2 mg/dL      Assessment & Plan:   Problem List Items Addressed This Visit    None    Visit Diagnoses    Encounter for physical examination related to employment    -  Primary   Relevant Orders   Urinalysis, dipstick only       Follow up plan: Return in about 1 year (around 03/22/2017).   DOT.  See from

## 2016-03-31 ENCOUNTER — Telehealth: Payer: Self-pay | Admitting: Family Medicine

## 2016-03-31 NOTE — Telephone Encounter (Signed)
Pt called stated he had issues with the last medication Dr. Jeananne Rama prescribed for acid reflux. Wants to know if something different can be called in. Please call pt to follow up. Pharm is [facility] in [place]. Thanks.

## 2016-03-31 NOTE — Telephone Encounter (Signed)
Routing to provider  

## 2016-04-02 MED ORDER — RANITIDINE HCL 150 MG PO CAPS: 150.0000 mg | ORAL_CAPSULE | Freq: Two times a day (BID) | ORAL | 1 refills | Status: DC

## 2016-04-02 NOTE — Telephone Encounter (Signed)
New Rx sent to his pharmacy. If he has trouble with this one, will need follow up to discuss changing medicine again.

## 2016-04-02 NOTE — Telephone Encounter (Signed)
Left message on patient's voicemail.

## 2016-04-26 ENCOUNTER — Telehealth: Payer: Self-pay | Admitting: Unknown Physician Specialty

## 2016-04-26 NOTE — Telephone Encounter (Signed)
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 Will call and let patient and his wife know when forms have been successfully faxed.

## 2016-04-26 NOTE — Telephone Encounter (Signed)
Spoke with Alwyn Ren about this DOT. I have tried to fax this DOT to the number provided and it failed so I am trying to send it again now. On the patient's medical card, we need the patient's signature. I called and left the patient's wife a message (DPR signed and can leave detailed message) letting her know everything that was going on. I also let her know that we needed her husband's signature on his DOT card and that I would call and let him know this as well. Will call patient's wife back once form has been successfully sent to [facility].

## 2016-04-27 NOTE — Telephone Encounter (Signed)
Patient also notified that DOT forms were successfully faxed.

## 2016-04-27 NOTE — Telephone Encounter (Signed)
DOT forms successfully faxed to [facility] office. Called and left patient's wife a VM letting her know that they went through.

## 2016-05-05 ENCOUNTER — Ambulatory Visit
Admission: RE | Admit: 2016-05-05 | Discharge: 2016-05-05 | Disposition: A | Discharge status: Home / Self Care | Payer: No Typology Code available for payment source | Source: Ambulatory Visit | Attending: Urology | Admitting: Urology

## 2016-05-19 ENCOUNTER — Ambulatory Visit
Admission: RE | Admit: 2016-05-19 | Discharge: 2016-05-19 | Disposition: A | Discharge status: Home / Self Care | Payer: No Typology Code available for payment source | Source: Ambulatory Visit | Attending: Urology | Admitting: Urology

## 2016-05-19 DIAGNOSIS — D751 Secondary polycythemia: Secondary | ICD-10-CM | POA: Insufficient documentation

## 2016-05-19 LAB — HEMOGLOBIN AND HEMATOCRIT, BLOOD
HCT: 50.2 % (ref 40.0–52.0)
Hemoglobin: 17.5 g/dL (ref 13.0–18.0)

## 2016-05-26 ENCOUNTER — Other Ambulatory Visit: Payer: Self-pay | Admitting: Family Medicine

## 2016-05-27 ENCOUNTER — Ambulatory Visit (INDEPENDENT_AMBULATORY_CARE_PROVIDER_SITE_OTHER): Payer: PRIVATE HEALTH INSURANCE | Admitting: Family Medicine

## 2016-05-27 ENCOUNTER — Encounter: Payer: Self-pay | Admitting: Family Medicine

## 2016-05-27 VITALS — BP 110/78 | HR 83 | Ht 69.0 in | Wt 194.5 lb

## 2016-05-27 DIAGNOSIS — Z1211 Encounter for screening for malignant neoplasm of colon: Secondary | ICD-10-CM

## 2016-05-27 DIAGNOSIS — I10 Essential (primary) hypertension: Secondary | ICD-10-CM | POA: Diagnosis not present

## 2016-05-27 DIAGNOSIS — K219 Gastro-esophageal reflux disease without esophagitis: Secondary | ICD-10-CM | POA: Diagnosis not present

## 2016-05-27 MED ORDER — ESOMEPRAZOLE MAGNESIUM 40 MG PO CPDR: 40.0000 mg | DELAYED_RELEASE_CAPSULE | Freq: Every day | ORAL | 3 refills | Status: DC

## 2016-05-27 NOTE — Progress Notes (Signed)
   BP 110/78   Pulse 83   Ht 5\' 9"  (1.753 m)   Wt 194 lb 8 oz (88.2 kg)   SpO2 96%   BMI 28.72 kg/m    Subjective:    Patient ID: Jorge Brown, male    DOB: 1961/04/02, 56 y.o.   MRN: [id number]  HPI: Jorge Brown is a 56 y.o. male  Chief Complaint  Patient presents with  . Gastroesophageal Reflux  Patient with worsening reflux symptoms tried Prilosec and helped a little bit with reflux but was so bloated with gas was too uncomfortable to continue taking. Has been taking over-the-counter Zantac which helps a little bit but still having a lot of symptoms. Has throat clearing acid hot throat symptoms no real coughing at night. His bed is not propped up.  Relevant past medical, surgical, family and social history reviewed and updated as indicated. Interim medical history since our last visit reviewed. Allergies and medications reviewed and updated.  Review of Systems  Constitutional: Negative.   Respiratory: Negative.   Cardiovascular: Negative.     Per HPI unless specifically indicated above     Objective:    BP 110/78   Pulse 83   Ht 5\' 9"  (1.753 m)   Wt 194 lb 8 oz (88.2 kg)   SpO2 96%   BMI 28.72 kg/m   Wt Readings from Last 3 Encounters:  05/27/16 194 lb 8 oz (88.2 kg)  03/22/16 193 lb 12.8 oz (87.9 kg)  03/08/16 194 lb 9.6 oz (88.3 kg)    Physical Exam  Constitutional: He is oriented to person, place, and time. He appears well-developed and well-nourished.  HENT:  Head: Normocephalic and atraumatic.  Eyes: Conjunctivae and EOM are normal.  Neck: Normal range of motion.  Cardiovascular: Normal rate, regular rhythm and normal heart sounds.   Pulmonary/Chest: Effort normal and breath sounds normal.  Abdominal: Soft. Bowel sounds are normal. He exhibits no distension and no mass. There is no tenderness. There is no rebound and no guarding.  Musculoskeletal: Normal range of motion.  Neurological: He is alert and oriented to person, place, and time.  Skin: No  erythema.  Psychiatric: He has a normal mood and affect. His behavior is normal. Judgment and thought content normal.    Results for orders placed or performed during the hospital encounter of 05/19/16  Hemoglobin and hematocrit, blood  Result Value Ref Range   Hemoglobin 17.5 13.0 - 18.0 g/dL   HCT 50.2 40.0 - 52.0 %      Assessment & Plan:   Problem List Items Addressed This Visit      Cardiovascular and Mediastinum   Hypertension - Primary    The current medical regimen is effective;  continue present plan and medications.       Relevant Orders   H. pylori antibody, IgA     Digestive   Acid reflux    Discuss reflux with patient Will do trial of Nexium and/or dexalant GI referral for evaluation of worsening reflux. Discuss dietary measures and propping up his bed.      Relevant Medications   esomeprazole (NEXIUM) 40 MG capsule   Other Relevant Orders   Ambulatory referral to Gastroenterology    Other Visit Diagnoses    Colon cancer screening       Relevant Orders   Cologuard       Follow up plan: Return for Physical Exam.

## 2016-05-27 NOTE — Assessment & Plan Note (Signed)
The current medical regimen is effective;  continue present plan and medications.  

## 2016-05-27 NOTE — Assessment & Plan Note (Signed)
Discuss reflux with patient Will do trial of Nexium and/or dexalant GI referral for evaluation of worsening reflux. Discuss dietary measures and propping up his bed.

## 2016-07-01 ENCOUNTER — Ambulatory Visit: Payer: PRIVATE HEALTH INSURANCE | Admitting: Gastroenterology

## 2016-07-01 ENCOUNTER — Ambulatory Visit (INDEPENDENT_AMBULATORY_CARE_PROVIDER_SITE_OTHER): Payer: No Typology Code available for payment source | Admitting: Gastroenterology

## 2016-07-01 ENCOUNTER — Encounter: Payer: Self-pay | Admitting: Gastroenterology

## 2016-07-01 ENCOUNTER — Other Ambulatory Visit: Payer: Self-pay

## 2016-07-01 VITALS — BP 141/93 | HR 70 | Temp 98.3°F | Ht 69.0 in | Wt 193.0 lb

## 2016-07-01 DIAGNOSIS — K219 Gastro-esophageal reflux disease without esophagitis: Secondary | ICD-10-CM

## 2016-07-01 DIAGNOSIS — Z1211 Encounter for screening for malignant neoplasm of colon: Secondary | ICD-10-CM

## 2016-07-01 NOTE — Progress Notes (Signed)
Gastroenterology Consultation  Referring Provider:     Guadalupe Maple, MD Primary Care Physician:  Golden Pop, MD Primary Gastroenterologist:  Dr. Allen Norris     Reason for Consultation:     GERD        HPI:   Jorge Brown is a 56 y.o. y/o male referred for consultation & management of GERD by Dr. Golden Pop, MD.  He comes in today with a report of having a severe burning in his mouth. The patient took some over-the-counter Prilosec and states that his symptoms were slightly better but was given a prescription of Prilosec and reports that his symptoms got much worse with a lot of abdominal bloating and discomfort. The patient also reports that he was put on another medication for his heartburn and believes it was Zantac. The patient states that when he took the Zantac his symptoms did not get any better. The patient stopped all his medications and he reports that he has not had any symptoms at the present time. The patient did report that he had dysphagia when the heartburn was the worst. There is no report of any unexplained weight loss, fevers, chills, nausea or vomiting. The patient also denies any family history of colon cancer colon polyps.  Past Medical History:  Diagnosis Date  . Cancer ([place])    skin  . Hypertension   . Meniere disease   . Testicular hypofunction     Past Surgical History:  Procedure Laterality Date  . implanted testosterone pellets  2012  . MENISCUS REPAIR Right 2014    Prior to Admission medications   Medication Sig Start Date End Date Taking? Authorizing Provider  benazepril (LOTENSIN) 40 MG tablet Take 1 tablet (40 mg total) by mouth daily. 08/28/15  Yes Guadalupe Maple, MD  hydrochlorothiazide (HYDRODIURIL) 25 MG tablet Take 1 tablet (25 mg total) by mouth daily. 08/28/15  Yes Guadalupe Maple, MD  esomeprazole (NEXIUM) 40 MG capsule Take 1 capsule (40 mg total) by mouth daily. Patient not taking: Reported on 07/01/2016 05/27/16   Guadalupe Maple, MD      Family History  Problem Relation Age of Onset  . Dementia Mother   . Heart disease Maternal Grandfather      Social History  Substance Use Topics  . Smoking status: Never Smoker  . Smokeless tobacco: Never Used  . Alcohol use 2.4 - 3.0 oz/week    4 - 5 Cans of beer per week     Comment: occasional glass of wine    Allergies as of 07/01/2016  . (No Known Allergies)    Review of Systems:    All systems reviewed and negative except where noted in HPI.   Physical Exam:  BP (!) 141/93   Pulse 70   Temp 98.3 F (36.8 C) (Oral)   Ht 5\' 9"  (1.753 m)   Wt 193 lb (87.5 kg)   BMI 28.50 kg/m  No LMP for male patient. Psych:  Alert and cooperative. Normal mood and affect. General:   Alert,  Well-developed, well-nourished, pleasant and cooperative in NAD Head:  Normocephalic and atraumatic. Eyes:  Sclera clear, no icterus.   Conjunctiva pink. Ears:  Normal auditory acuity. Nose:  No deformity, discharge, or lesions. Mouth:  No deformity or lesions,oropharynx pink & moist. Neck:  Supple; no masses or thyromegaly. Lungs:  Respirations even and unlabored.  Clear throughout to auscultation.   No wheezes, crackles, or rhonchi. No acute distress. Heart:  Regular rate  and rhythm; no murmurs, clicks, rubs, or gallops. Abdomen:  Normal bowel sounds.  No bruits.  Soft, non-tender and non-distended without masses, hepatosplenomegaly or hernias noted.  No guarding or rebound tenderness.  Negative Carnett sign.   Rectal:  Deferred.  Msk:  Symmetrical without gross deformities.  Good, equal movement & strength bilaterally. Pulses:  Normal pulses noted. Extremities:  No clubbing or edema.  No cyanosis. Neurologic:  Alert and oriented x3;  grossly normal neurologically. Skin:  Intact without significant lesions or rashes.  No jaundice. Lymph Nodes:  No significant cervical adenopathy. Psych:  Alert and cooperative. Normal mood and affect.  Imaging Studies: No results found.  Assessment  and Plan:   Jorge Brown is a 56 y.o. y/o male who comes in with a history of some dysphagia and GERD that has since resolved. The patient did not have any overt heartburn. The patient will be set up for an EGD to evaluate him for a hiatal hernia versus Barrett's esophagus. The patient will also be set up for a screening colonoscopy and she is never had a colonoscopy. I have discussed risks & benefits which include, but are not limited to, bleeding, infection, perforation & drug reaction.  The patient agrees with this plan & written consent will be obtained.       Lucilla Lame, MD. Marval Regal   Note: This dictation was prepared with Dragon dictation along with smaller phrase technology. Any transcriptional errors that result from this process are unintentional.

## 2016-07-21 ENCOUNTER — Encounter: Payer: Self-pay | Admitting: *Deleted

## 2016-07-21 ENCOUNTER — Telehealth: Payer: Self-pay | Admitting: Gastroenterology

## 2016-07-21 ENCOUNTER — Other Ambulatory Visit: Payer: Self-pay

## 2016-07-21 MED ORDER — NA SULFATE-K SULFATE-MG SULF 17.5-3.13-1.6 GM/177ML PO SOLN: 1.0000 | ORAL | 0 refills | Status: DC

## 2016-07-21 NOTE — Telephone Encounter (Signed)
Suprep called into pt's pharmacy per his request.

## 2016-07-21 NOTE — Telephone Encounter (Signed)
Please call in prep for tomorrow to [facility] in [place]

## 2016-07-22 NOTE — Discharge Instructions (Signed)

## 2016-07-23 ENCOUNTER — Ambulatory Visit: Payer: No Typology Code available for payment source | Admitting: Anesthesiology

## 2016-07-23 ENCOUNTER — Ambulatory Visit
Admission: RE | Admit: 2016-07-23 | Discharge: 2016-07-23 | Disposition: A | Discharge status: Home / Self Care | Payer: No Typology Code available for payment source | Source: Ambulatory Visit | Attending: Gastroenterology | Admitting: Gastroenterology

## 2016-07-23 ENCOUNTER — Telehealth: Payer: Self-pay | Admitting: Gastroenterology

## 2016-07-23 ENCOUNTER — Encounter
Admission: RE | Disposition: A | Discharge status: Home / Self Care | Payer: Self-pay | Source: Ambulatory Visit | Attending: Gastroenterology

## 2016-07-23 DIAGNOSIS — K259 Gastric ulcer, unspecified as acute or chronic, without hemorrhage or perforation: Secondary | ICD-10-CM | POA: Insufficient documentation

## 2016-07-23 DIAGNOSIS — Z1211 Encounter for screening for malignant neoplasm of colon: Secondary | ICD-10-CM

## 2016-07-23 DIAGNOSIS — E291 Testicular hypofunction: Secondary | ICD-10-CM | POA: Insufficient documentation

## 2016-07-23 DIAGNOSIS — Z8249 Family history of ischemic heart disease and other diseases of the circulatory system: Secondary | ICD-10-CM | POA: Diagnosis not present

## 2016-07-23 DIAGNOSIS — R12 Heartburn: Secondary | ICD-10-CM | POA: Diagnosis not present

## 2016-07-23 DIAGNOSIS — Z820 Family history of epilepsy and other diseases of the nervous system: Secondary | ICD-10-CM | POA: Insufficient documentation

## 2016-07-23 DIAGNOSIS — D122 Benign neoplasm of ascending colon: Secondary | ICD-10-CM | POA: Diagnosis not present

## 2016-07-23 DIAGNOSIS — K219 Gastro-esophageal reflux disease without esophagitis: Secondary | ICD-10-CM | POA: Insufficient documentation

## 2016-07-23 DIAGNOSIS — Z85828 Personal history of other malignant neoplasm of skin: Secondary | ICD-10-CM | POA: Diagnosis not present

## 2016-07-23 DIAGNOSIS — D125 Benign neoplasm of sigmoid colon: Secondary | ICD-10-CM

## 2016-07-23 DIAGNOSIS — K2950 Unspecified chronic gastritis without bleeding: Secondary | ICD-10-CM | POA: Insufficient documentation

## 2016-07-23 DIAGNOSIS — H8109 Meniere's disease, unspecified ear: Secondary | ICD-10-CM | POA: Insufficient documentation

## 2016-07-23 DIAGNOSIS — I10 Essential (primary) hypertension: Secondary | ICD-10-CM | POA: Diagnosis not present

## 2016-07-23 DIAGNOSIS — Z79899 Other long term (current) drug therapy: Secondary | ICD-10-CM | POA: Diagnosis not present

## 2016-07-23 DIAGNOSIS — K635 Polyp of colon: Secondary | ICD-10-CM

## 2016-07-23 DIAGNOSIS — D120 Benign neoplasm of cecum: Secondary | ICD-10-CM

## 2016-07-23 DIAGNOSIS — K253 Acute gastric ulcer without hemorrhage or perforation: Secondary | ICD-10-CM | POA: Diagnosis not present

## 2016-07-23 DIAGNOSIS — K641 Second degree hemorrhoids: Secondary | ICD-10-CM | POA: Diagnosis not present

## 2016-07-23 HISTORY — PX: COLONOSCOPY WITH PROPOFOL: SHX5780

## 2016-07-23 HISTORY — PX: POLYPECTOMY: SHX5525

## 2016-07-23 HISTORY — DX: Gastro-esophageal reflux disease without esophagitis: K21.9

## 2016-07-23 HISTORY — PX: ESOPHAGOGASTRODUODENOSCOPY (EGD) WITH PROPOFOL: SHX5813

## 2016-07-23 SURGERY — COLONOSCOPY WITH PROPOFOL
Anesthesia: Monitor Anesthesia Care | Wound class: Contaminated

## 2016-07-23 MED ORDER — GLYCOPYRROLATE 0.2 MG/ML IJ SOLN
INTRAMUSCULAR | Status: DC | PRN
  Administered 2016-07-23: 0.2 mg via INTRAVENOUS

## 2016-07-23 MED ORDER — STERILE WATER FOR IRRIGATION IR SOLN
Status: DC | PRN
  Administered 2016-07-23: 09:00:00

## 2016-07-23 MED ORDER — LIDOCAINE HCL (CARDIAC) 20 MG/ML IV SOLN
INTRAVENOUS | Status: DC | PRN
  Administered 2016-07-23: 50 mg via INTRAVENOUS

## 2016-07-23 MED ORDER — PROPOFOL 10 MG/ML IV BOLUS
INTRAVENOUS | Status: DC | PRN
  Administered 2016-07-23: 50 mg via INTRAVENOUS
  Administered 2016-07-23: 100 mg via INTRAVENOUS
  Administered 2016-07-23: 20 mg via INTRAVENOUS
  Administered 2016-07-23: 50 mg via INTRAVENOUS
  Administered 2016-07-23: 20 mg via INTRAVENOUS
  Administered 2016-07-23: 50 mg via INTRAVENOUS
  Administered 2016-07-23: 20 mg via INTRAVENOUS
  Administered 2016-07-23: 50 mg via INTRAVENOUS

## 2016-07-23 MED ORDER — SODIUM CHLORIDE 0.9 % IV SOLN: INTRAVENOUS | Status: DC

## 2016-07-23 MED ORDER — LACTATED RINGERS IV SOLN
INTRAVENOUS | Status: DC
  Administered 2016-07-23: 08:00:00 via INTRAVENOUS

## 2016-07-23 SURGICAL SUPPLY — 35 items
BALLN DILATOR 10-12 8 (BALLOONS)
BALLN DILATOR 12-15 8 (BALLOONS)
BALLN DILATOR 15-18 8 (BALLOONS)
BALLN DILATOR CRE 0-12 8 (BALLOONS)
BALLN DILATOR ESOPH 8 10 CRE (MISCELLANEOUS) IMPLANT
BALLOON DILATOR 12-15 8 (BALLOONS) IMPLANT
BALLOON DILATOR 15-18 8 (BALLOONS) IMPLANT
BALLOON DILATOR CRE 0-12 8 (BALLOONS) IMPLANT
BLOCK BITE 60FR ADLT L/F GRN (MISCELLANEOUS) ×4 IMPLANT
CANISTER SUCT 1200ML W/VALVE (MISCELLANEOUS) ×4 IMPLANT
CLIP HMST 235XBRD CATH ROT (MISCELLANEOUS) IMPLANT
CLIP RESOLUTION 360 11X235 (MISCELLANEOUS)
FCP ESCP3.2XJMB 240X2.8X (MISCELLANEOUS)
FORCEPS BIOP RAD 4 LRG CAP 4 (CUTTING FORCEPS) ×4 IMPLANT
FORCEPS BIOP RJ4 240 W/NDL (MISCELLANEOUS)
FORCEPS ESCP3.2XJMB 240X2.8X (MISCELLANEOUS) IMPLANT
GOWN CVR UNV OPN BCK APRN NK (MISCELLANEOUS) ×4 IMPLANT
GOWN ISOL THUMB LOOP REG UNIV (MISCELLANEOUS) ×4
INJECTOR VARIJECT VIN23 (MISCELLANEOUS) IMPLANT
KIT DEFENDO VALVE AND CONN (KITS) IMPLANT
KIT ENDO PROCEDURE OLY (KITS) ×4 IMPLANT
MARKER SPOT ENDO TATTOO 5ML (MISCELLANEOUS) IMPLANT
PAD GROUND ADULT SPLIT (MISCELLANEOUS) IMPLANT
PROBE APC STR FIRE (PROBE) IMPLANT
RETRIEVER NET PLAT FOOD (MISCELLANEOUS) IMPLANT
RETRIEVER NET ROTH 2.5X230 LF (MISCELLANEOUS) IMPLANT
SNARE SHORT THROW 13M SML OVAL (MISCELLANEOUS) ×4 IMPLANT
SNARE SHORT THROW 30M LRG OVAL (MISCELLANEOUS) IMPLANT
SNARE SNG USE RND 15MM (INSTRUMENTS) IMPLANT
SPOT EX ENDOSCOPIC TATTOO (MISCELLANEOUS)
SYR INFLATION 60ML (SYRINGE) IMPLANT
TRAP ETRAP POLY (MISCELLANEOUS) ×4 IMPLANT
VARIJECT INJECTOR VIN23 (MISCELLANEOUS)
WATER STERILE IRR 250ML POUR (IV SOLUTION) ×4 IMPLANT
WIRE CRE 18-20MM 8CM F G (MISCELLANEOUS) IMPLANT

## 2016-07-23 NOTE — Telephone Encounter (Signed)
07/23/16 Fax received from [facility] with Authorization # [id number] for EGD & Colonoscopy Z12.11 & K21.9

## 2016-07-23 NOTE — Anesthesia Postprocedure Evaluation (Signed)
Anesthesia Post Note  Patient: [name]  Procedure(s) Performed: Procedure(s) (LRB): COLONOSCOPY WITH PROPOFOL (N/A) ESOPHAGOGASTRODUODENOSCOPY (EGD) WITH PROPOFOL (N/A) POLYPECTOMY  Patient location during evaluation: PACU Anesthesia Type: MAC Level of consciousness: awake and alert Pain management: pain level controlled Vital Signs Assessment: post-procedure vital signs reviewed and stable Respiratory status: spontaneous breathing, nonlabored ventilation and respiratory function stable Cardiovascular status: blood pressure returned to baseline and stable Postop Assessment: no signs of nausea or vomiting Anesthetic complications: no    Veda Canning

## 2016-07-23 NOTE — Op Note (Signed)
[facility] Gastroenterology Patient Name: Jorge Brown Procedure Date: 07/23/2016 8:59 AM MRN: [id number] Account #: [id number] Date of Birth: April 03, 1961 Admit Type: Outpatient Age: 56 Room: [facility] OR ROOM 01 Gender: Male Note Status: Finalized Procedure:            Upper GI endoscopy Indications:          Heartburn Providers:            Lucilla Lame MD, MD Referring MD:         Guadalupe Maple, MD (Referring MD) Medicines:            Propofol per Anesthesia Complications:        No immediate complications. Procedure:            Pre-Anesthesia Assessment:                       - Prior to the procedure, a History and Physical was                        performed, and patient medications and allergies were                        reviewed. The patient's tolerance of previous                        anesthesia was also reviewed. The risks and benefits of                        the procedure and the sedation options and risks were                        discussed with the patient. All questions were                        answered, and informed consent was obtained. Prior                        Anticoagulants: The patient has taken no previous                        anticoagulant or antiplatelet agents. ASA Grade                        Assessment: II - A patient with mild systemic disease.                        After reviewing the risks and benefits, the patient was                        deemed in satisfactory condition to undergo the                        procedure.                       After obtaining informed consent, the endoscope was                        passed under direct vision. Throughout the procedure,  the patient's blood pressure, pulse, and oxygen                        saturations were monitored continuously. The Olympus                        [id number]-[id number] Endoscope (S#. [id number]) was introduced                        through the  mouth, and advanced to the second part of                        duodenum. The upper GI endoscopy was accomplished                        without difficulty. The patient tolerated the procedure                        well. Findings:      The examined esophagus was normal.      Few non-bleeding linear gastric ulcers with pigmented material were       found in the gastric body. Biopsies were taken with a cold forceps for       histology.      The examined duodenum was normal. Impression:           - Normal esophagus.                       - Non-bleeding gastric ulcers with pigmented material.                        Biopsied.                       - Normal examined duodenum. Recommendation:       - Discharge patient to home.                       - Resume previous diet.                       - Continue present medications.                       - Await pathology results.                       - Use a proton pump inhibitor PO daily. Procedure Code(s):    --- Professional ---                       [id number], Esophagogastroduodenoscopy, flexible, transoral;                        with biopsy, single or multiple Diagnosis Code(s):    --- Professional ---                       R12, Heartburn                       K25.9, Gastric ulcer, unspecified as acute or chronic,  without hemorrhage or perforation CPT copyright 2016 American Medical Association. All rights reserved. The codes documented in this report are preliminary and upon coder review may  be revised to meet current compliance requirements. Lucilla Lame MD, MD 07/23/2016 9:09:19 AM This report has been signed electronically. Number of Addenda: 0 Note Initiated On: 07/23/2016 8:59 AM      [facility]

## 2016-07-23 NOTE — Anesthesia Procedure Notes (Signed)
Procedure Name: MAC Performed by: Sabrinia Prien Pre-anesthesia Checklist: Patient identified, Emergency Drugs available, Suction available, Timeout performed and Patient being monitored Patient Re-evaluated:Patient Re-evaluated prior to inductionOxygen Delivery Method: Nasal cannula Placement Confirmation: positive ETCO2     

## 2016-07-23 NOTE — H&P (Signed)
   Lucilla Lame, MD [address] [place], [place] [address] [phone] Fax : [phone]  Primary Care Physician:  Golden Pop, MD Primary Gastroenterologist:  Dr. Allen Norris  Pre-Procedure History & Physical: HPI:  PASCHAL BLANTON is a 56 y.o. male is here for an endoscopy and colonoscopy.   Past Medical History:  Diagnosis Date  . Cancer ([place])    skin  . GERD (gastroesophageal reflux disease)   . Hypertension   . Meniere disease   . Testicular hypofunction     Past Surgical History:  Procedure Laterality Date  . implanted testosterone pellets  2012  . MENISCUS REPAIR Right 2014    Prior to Admission medications   Medication Sig Start Date End Date Taking? Authorizing Provider  benazepril (LOTENSIN) 40 MG tablet Take 1 tablet (40 mg total) by mouth daily. 08/28/15  Yes Guadalupe Maple, MD  hydrochlorothiazide (HYDRODIURIL) 25 MG tablet Take 1 tablet (25 mg total) by mouth daily. 08/28/15  Yes Guadalupe Maple, MD  Na Sulfate-K Sulfate-Mg Sulf (SUPREP BOWEL PREP KIT) 17.5-3.13-1.6 GM/180ML SOLN Take 1 kit by mouth as directed. 07/21/16  Yes Lucilla Lame, MD    Allergies as of 07/01/2016  . (No Known Allergies)    Family History  Problem Relation Age of Onset  . Dementia Mother   . Heart disease Maternal Grandfather     Social History   Social History  . Marital status: Married    Spouse name: N/A  . Number of children: N/A  . Years of education: N/A   Occupational History  . Not on file.   Social History Main Topics  . Smoking status: Never Smoker  . Smokeless tobacco: Never Used  . Alcohol use 4.8 - 5.4 oz/week    7 Glasses of wine, 1 - 2 Cans of beer per week     Comment:    . Drug use: No  . Sexual activity: Yes   Other Topics Concern  . Not on file   Social History Narrative  . No narrative on file    Review of Systems: See HPI, otherwise negative ROS  Physical Exam: BP (!) 144/110   Pulse 87   Temp 98.1 F (36.7 C)  (Temporal)   Resp 16   Ht _0  (1.753 m)   Wt 183 lb (83 kg)   SpO2 100%   BMI 27.02 kg/m  General:   Alert,  pleasant and cooperative in NAD Head:  Normocephalic and atraumatic. Neck:  Supple; no masses or thyromegaly. Lungs:  Clear throughout to auscultation.    Heart:  Regular rate and rhythm. Abdomen:  Soft, nontender and nondistended. Normal bowel sounds, without guarding, and without rebound.   Neurologic:  Alert and  oriented x4;  grossly normal neurologically.  Impression/Plan: [name] is here for an endoscopy and colonoscopy to be performed for GERD and screening  Risks, benefits, limitations, and alternatives regarding  endoscopy and colonoscopy have been reviewed with the patient.  Questions have been answered.  All parties agreeable.   Lucilla Lame, MD  07/23/2016, 8:38 AM

## 2016-07-23 NOTE — Anesthesia Preprocedure Evaluation (Signed)
Anesthesia Evaluation  Patient identified by MRN, date of birth, ID band Patient awake    Reviewed: Allergy & Precautions, NPO status   History of Anesthesia Complications Negative for: history of anesthetic complications  Airway Mallampati: I      Comment: Jaw pops with wide opening Dental  (+) Teeth Intact   Pulmonary neg pulmonary ROS,    breath sounds clear to auscultation       Cardiovascular hypertension,  Rhythm:Regular Rate:Normal     Neuro/Psych    GI/Hepatic GERD  ,  Endo/Other  Testicular hypofunction   Renal/GU      Musculoskeletal   Abdominal   Peds  Hematology   Anesthesia Other Findings   Reproductive/Obstetrics                            Anesthesia Physical Anesthesia Plan  ASA: II  Anesthesia Plan: MAC   Post-op Pain Management:    Induction:   Airway Management Planned: Nasal Cannula  Additional Equipment:   Intra-op Plan:   Post-operative Plan:   Informed Consent: I have reviewed the patients History and Physical, chart, labs and discussed the procedure including the risks, benefits and alternatives for the proposed anesthesia with the patient or authorized representative who has indicated his/her understanding and acceptance.     Plan Discussed with: CRNA  Anesthesia Plan Comments:         Anesthesia Quick Evaluation

## 2016-07-23 NOTE — Op Note (Signed)
[facility] Gastroenterology Patient Name: Jorge Brown Procedure Date: 07/23/2016 8:59 AM MRN: [id number] Account #: [id number] Date of Birth: Nov 30, 1960 Admit Type: Outpatient Age: 56 Room: [facility] OR ROOM 01 Gender: Male Note Status: Finalized Procedure:            Colonoscopy Indications:          Screening for colorectal malignant neoplasm Providers:            Lucilla Lame MD, MD Medicines:            Propofol per Anesthesia Complications:        No immediate complications. Procedure:            Pre-Anesthesia Assessment:                       - Prior to the procedure, a History and Physical was                        performed, and patient medications and allergies were                        reviewed. The patient's tolerance of previous                        anesthesia was also reviewed. The risks and benefits of                        the procedure and the sedation options and risks were                        discussed with the patient. All questions were                        answered, and informed consent was obtained. Prior                        Anticoagulants: The patient has taken no previous                        anticoagulant or antiplatelet agents. ASA Grade                        Assessment: II - A patient with mild systemic disease.                        After reviewing the risks and benefits, the patient was                        deemed in satisfactory condition to undergo the                        procedure.                       After obtaining informed consent, the colonoscope was                        passed under direct vision. Throughout the procedure,                        the patient's blood pressure,  pulse, and oxygen                        saturations were monitored continuously. The [place] [id number]) was introduced through the                        anus and advanced to the the cecum,  identified by                        appendiceal orifice and ileocecal valve. The                        colonoscopy was performed without difficulty. The                        patient tolerated the procedure well. The quality of                        the bowel preparation was excellent. Findings:      The perianal and digital rectal examinations were normal.      A 2 mm polyp was found in the cecum. The polyp was sessile. The polyp       was removed with a cold biopsy forceps. Resection and retrieval were       complete.      A 3 mm polyp was found in the ascending colon. The polyp was sessile.       The polyp was removed with a cold biopsy forceps. Resection and       retrieval were complete.      A 6 mm polyp was found in the sigmoid colon. The polyp was pedunculated.       The polyp was removed with a cold snare. Resection and retrieval were       complete.      Non-bleeding internal hemorrhoids were found during retroflexion. The       hemorrhoids were Grade II (internal hemorrhoids that prolapse but reduce       spontaneously). Impression:           - One 2 mm polyp in the cecum, removed with a cold                        biopsy forceps. Resected and retrieved.                       - One 3 mm polyp in the ascending colon, removed with a                        cold biopsy forceps. Resected and retrieved.                       - One 6 mm polyp in the sigmoid colon, removed with a                        cold snare. Resected and retrieved.                       - Non-bleeding internal hemorrhoids. Recommendation:       -  Discharge patient to home.                       - Resume previous diet.                       - Continue present medications.                       - Await pathology results.                       - Repeat colonoscopy in 5 years if polyp adenoma and 10                        years if hyperplastic Procedure Code(s):    --- Professional ---                        [phone], Colonoscopy, flexible; with removal of tumor(s),                        polyp(s), or other lesion(s) by snare technique                       [id number], 42, Colonoscopy, flexible; with biopsy, single                        or multiple Diagnosis Code(s):    --- Professional ---                       Z12.11, Encounter for screening for malignant neoplasm                        of colon                       D12.0, Benign neoplasm of cecum                       D12.2, Benign neoplasm of ascending colon                       D12.5, Benign neoplasm of sigmoid colon CPT copyright 2016 American Medical Association. All rights reserved. The codes documented in this report are preliminary and upon coder review may  be revised to meet current compliance requirements. Lucilla Lame MD, MD 07/23/2016 9:25:40 AM This report has been signed electronically. Number of Addenda: 0 Note Initiated On: 07/23/2016 8:59 AM Scope Withdrawal Time: 0 hours 8 minutes 13 seconds  Total Procedure Duration: 0 hours 12 minutes 39 seconds       [facility]

## 2016-07-23 NOTE — Transfer of Care (Signed)
Immediate Anesthesia Transfer of Care Note  Patient: Jorge Brown  Procedure(s) Performed: Procedure(s): COLONOSCOPY WITH PROPOFOL (N/A) ESOPHAGOGASTRODUODENOSCOPY (EGD) WITH PROPOFOL (N/A) POLYPECTOMY  Patient Location: PACU  Anesthesia Type: MAC  Level of Consciousness: awake, alert  and patient cooperative  Airway and Oxygen Therapy: Patient Spontanous Breathing and Patient connected to supplemental oxygen  Post-op Assessment: Post-op Vital signs reviewed, Patient's Cardiovascular Status Stable, Respiratory Function Stable, Patent Airway and No signs of Nausea or vomiting  Post-op Vital Signs: Reviewed and stable  Complications: No apparent anesthesia complications

## 2016-07-26 ENCOUNTER — Encounter: Payer: Self-pay | Admitting: Gastroenterology

## 2016-07-29 ENCOUNTER — Encounter: Payer: Self-pay | Admitting: Gastroenterology

## 2016-08-02 ENCOUNTER — Other Ambulatory Visit: Payer: Self-pay

## 2016-08-27 ENCOUNTER — Telehealth: Payer: Self-pay | Admitting: Family Medicine

## 2016-08-27 MED ORDER — HYDROCHLOROTHIAZIDE 25 MG PO TABS: 25.0000 mg | ORAL_TABLET | Freq: Every day | ORAL | 0 refills | Status: DC

## 2016-08-27 MED ORDER — BENAZEPRIL HCL 40 MG PO TABS: 40.0000 mg | ORAL_TABLET | Freq: Every day | ORAL | 0 refills | Status: DC

## 2016-08-27 NOTE — Telephone Encounter (Signed)
Patient called to request a temporary medication refill for his medications Benazepril and Hydrochlorothiazide be sent to the pharmacy in [place] due to being on vacation this weekend. Patient stated that he forgot his blood pressure medicine at home and went on vacation without the medicine. Patient was informed that depending on time frame circumstances it may not come in today. Patient stated that he will be back in town next week and also has an appointment scheduled this Thursday 09/02/2016. Informed patient I will still send a message to document that he does not have his medicine.   Patient also provided the pharmacy address to where he is located in [place] this weekend: [place], [place].  Please Advise.  Thank you

## 2016-08-27 NOTE — Telephone Encounter (Signed)
Routing to provider. Patient is out of town and forgot his meds.

## 2016-08-27 NOTE — Telephone Encounter (Signed)
Medication sent for 1 month supply to [place]

## 2016-08-29 ENCOUNTER — Other Ambulatory Visit: Payer: Self-pay | Admitting: Family Medicine

## 2016-08-31 NOTE — Telephone Encounter (Signed)
Last OV: 05/27/16 Next OV: 09/02/16  BMP Latest Ref Rng & Units 03/08/2016 10/17/2015 08/28/2015  Glucose 65 - 99 mg/dL 84 87 97  BUN 6 - 24 mg/dL 16 20 20   Creatinine 0.76 - 1.27 mg/dL 1.07 0.96 0.99  BUN/Creat Ratio 9 - 20 15 21(H) 20  Sodium 134 - 144 mmol/L 139 142 140  Potassium 3.5 - 5.2 mmol/L 4.2 4.2 4.0  Chloride 96 - 106 mmol/L 97 101 98  CO2 18 - 29 mmol/L 26 25 23   Calcium 8.7 - 10.2 mg/dL 9.7 9.6 9.2

## 2016-09-02 ENCOUNTER — Ambulatory Visit (INDEPENDENT_AMBULATORY_CARE_PROVIDER_SITE_OTHER): Payer: PRIVATE HEALTH INSURANCE | Admitting: Family Medicine

## 2016-09-02 ENCOUNTER — Encounter: Payer: Self-pay | Admitting: Family Medicine

## 2016-09-02 VITALS — BP 118/86 | HR 86 | Ht 70.47 in | Wt 191.0 lb

## 2016-09-02 DIAGNOSIS — Z0000 Encounter for general adult medical examination without abnormal findings: Secondary | ICD-10-CM

## 2016-09-02 DIAGNOSIS — I10 Essential (primary) hypertension: Secondary | ICD-10-CM

## 2016-09-02 DIAGNOSIS — Z1329 Encounter for screening for other suspected endocrine disorder: Secondary | ICD-10-CM

## 2016-09-02 DIAGNOSIS — E291 Testicular hypofunction: Secondary | ICD-10-CM

## 2016-09-02 DIAGNOSIS — C61 Malignant neoplasm of prostate: Secondary | ICD-10-CM

## 2016-09-02 DIAGNOSIS — Z13220 Encounter for screening for lipoid disorders: Secondary | ICD-10-CM

## 2016-09-02 LAB — URINALYSIS, ROUTINE W REFLEX MICROSCOPIC
Bilirubin, UA: NEGATIVE
GLUCOSE, UA: NEGATIVE
KETONES UA: NEGATIVE
Leukocytes, UA: NEGATIVE
Nitrite, UA: NEGATIVE
PROTEIN UA: NEGATIVE
RBC, UA: NEGATIVE
Specific Gravity, UA: 1.015 (ref 1.005–1.030)
UUROB: 1 mg/dL (ref 0.2–1.0)
pH, UA: 7 (ref 5.0–7.5)

## 2016-09-02 MED ORDER — BENAZEPRIL HCL 40 MG PO TABS: 40.0000 mg | ORAL_TABLET | Freq: Every day | ORAL | 4 refills | Status: DC

## 2016-09-02 MED ORDER — HYDROCHLOROTHIAZIDE 25 MG PO TABS: 25.0000 mg | ORAL_TABLET | Freq: Every day | ORAL | 4 refills | Status: DC

## 2016-09-02 NOTE — Assessment & Plan Note (Signed)
The current medical regimen is effective;  continue present plan and medications.  

## 2016-09-02 NOTE — Progress Notes (Signed)
BP 118/86 (BP Location: Left Arm)   Pulse 86   Ht 5' 10.47" (1.79 m)   Wt 191 lb (86.6 kg)   SpO2 98%   BMI 27.04 kg/m    Subjective:    Patient ID: Jorge Brown, male    DOB: 05-02-1960, 56 y.o.   MRN: [id number]  HPI: Jorge Brown is a 55 y.o. male  Chief Complaint  Patient presents with  . Annual Exam   Patient just finished colonoscopy found some polyps was told 3 year follow-up otherwise been doing well no other concerns or complaints. Blood pressure home checking does well did well at colonoscopy in here today. On second check. Aches medications without problems. Relevant past medical, surgical, family and social history reviewed and updated as indicated. Interim medical history since our last visit reviewed. Allergies and medications reviewed and updated.  Review of Systems  Constitutional: Negative.   HENT: Negative.   Eyes: Negative.   Respiratory: Negative.   Cardiovascular: Negative.   Gastrointestinal: Negative.   Endocrine: Negative.   Genitourinary: Negative.   Musculoskeletal: Negative.   Skin: Negative.   Allergic/Immunologic: Negative.   Neurological: Negative.   Hematological: Negative.   Psychiatric/Behavioral: Negative.     Per HPI unless specifically indicated above     Objective:    BP 118/86 (BP Location: Left Arm)   Pulse 86   Ht 5' 10.47" (1.79 m)   Wt 191 lb (86.6 kg)   SpO2 98%   BMI 27.04 kg/m   Wt Readings from Last 3 Encounters:  09/02/16 191 lb (86.6 kg)  07/23/16 183 lb (83 kg)  07/01/16 193 lb (87.5 kg)    Physical Exam  Constitutional: He is oriented to person, place, and time. He appears well-developed and well-nourished.  HENT:  Head: Normocephalic and atraumatic.  Right Ear: External ear normal.  Left Ear: External ear normal.  Eyes: Conjunctivae and EOM are normal. Pupils are equal, round, and reactive to light.  Neck: Normal range of motion. Neck supple.  Cardiovascular: Normal rate, regular rhythm, normal  heart sounds and intact distal pulses.   Pulmonary/Chest: Effort normal and breath sounds normal.  Abdominal: Soft. Bowel sounds are normal. There is no splenomegaly or hepatomegaly.  Genitourinary: Penis normal.  Genitourinary Comments: Done at colonoscopy  Musculoskeletal: Normal range of motion.  Neurological: He is alert and oriented to person, place, and time. He has normal reflexes.  Skin: No rash noted. No erythema.  Psychiatric: He has a normal mood and affect. His behavior is normal. Judgment and thought content normal.    Results for orders placed or performed during the hospital encounter of 05/19/16  Hemoglobin and hematocrit, blood  Result Value Ref Range   Hemoglobin 17.5 13.0 - 18.0 g/dL   HCT 50.2 40.0 - 52.0 %      Assessment & Plan:   Problem List Items Addressed This Visit      Cardiovascular and Mediastinum   Hypertension    The current medical regimen is effective;  continue present plan and medications.       Relevant Orders   CBC with Differential/Platelet   Comprehensive metabolic panel   Urinalysis, Routine w reflex microscopic     Endocrine   Testicular hypofunction    The current medical regimen is effective;  continue present plan and medications.        Other Visit Diagnoses    Routine general medical examination at a health care facility    -  Primary  Relevant Orders   CBC with Differential/Platelet   Comprehensive metabolic panel   Lipid panel   PSA   TSH   Urinalysis, Routine w reflex microscopic   Screening cholesterol level       Relevant Orders   Lipid panel   Prostate cancer ([place])       Relevant Orders   PSA   Thyroid disorder screen       Relevant Orders   TSH       Follow up plan: Return in about 6 months (around 03/04/2017) for BMP.

## 2016-09-03 LAB — COMPREHENSIVE METABOLIC PANEL
A/G RATIO: 2 (ref 1.2–2.2)
ALT: 27 IU/L (ref 0–44)
AST: 19 IU/L (ref 0–40)
Albumin: 4.7 g/dL (ref 3.5–5.5)
Alkaline Phosphatase: 54 IU/L (ref 39–117)
BUN/Creatinine Ratio: 20 (ref 9–20)
BUN: 22 mg/dL (ref 6–24)
Bilirubin Total: 1.3 mg/dL — ABNORMAL HIGH (ref 0.0–1.2)
CO2: 24 mmol/L (ref 18–29)
Calcium: 10.1 mg/dL (ref 8.7–10.2)
Chloride: 99 mmol/L (ref 96–106)
Creatinine, Ser: 1.09 mg/dL (ref 0.76–1.27)
GFR calc non Af Amer: 77 mL/min/{1.73_m2} (ref >59)
GFR, EST AFRICAN AMERICAN: 89 mL/min/{1.73_m2} (ref >59)
GLOBULIN, TOTAL: 2.4 g/dL (ref 1.5–4.5)
Glucose: 80 mg/dL (ref 65–99)
Potassium: 4 mmol/L (ref 3.5–5.2)
SODIUM: 139 mmol/L (ref 134–144)
TOTAL PROTEIN: 7.1 g/dL (ref 6.0–8.5)

## 2016-09-03 LAB — CBC WITH DIFFERENTIAL/PLATELET
Basophils Absolute: 0 10*3/uL (ref 0.0–0.2)
Basos: 0 %
EOS (ABSOLUTE): 0.1 10*3/uL (ref 0.0–0.4)
EOS: 1 %
HEMATOCRIT: 55.2 % — AB (ref 37.5–51.0)
HEMOGLOBIN: 19.2 g/dL — AB (ref 13.0–17.7)
IMMATURE GRANS (ABS): 0 10*3/uL (ref 0.0–0.1)
IMMATURE GRANULOCYTES: 0 %
LYMPHS: 25 %
Lymphocytes Absolute: 2.5 10*3/uL (ref 0.7–3.1)
MCH: 33.2 pg — ABNORMAL HIGH (ref 26.6–33.0)
MCHC: 34.8 g/dL (ref 31.5–35.7)
MCV: 95 fL (ref 79–97)
Monocytes Absolute: 0.8 10*3/uL (ref 0.1–0.9)
Monocytes: 8 %
NEUTROS PCT: 66 %
Neutrophils Absolute: 6.7 10*3/uL (ref 1.4–7.0)
Platelets: 204 10*3/uL (ref 150–379)
RBC: 5.79 x10E6/uL (ref 4.14–5.80)
RDW: 13.4 % (ref 12.3–15.4)
WBC: 10.1 10*3/uL (ref 3.4–10.8)

## 2016-09-03 LAB — LIPID PANEL
CHOL/HDL RATIO: 5.1 ratio — AB (ref 0.0–5.0)
Cholesterol, Total: 199 mg/dL (ref 100–199)
HDL: 39 mg/dL — AB (ref >39)
LDL CALC: 82 mg/dL (ref 0–99)
Triglycerides: 390 mg/dL — ABNORMAL HIGH (ref 0–149)
VLDL Cholesterol Cal: 78 mg/dL — ABNORMAL HIGH (ref 5–40)

## 2016-09-03 LAB — TSH: TSH: 0.896 u[IU]/mL (ref 0.450–4.500)

## 2016-09-03 LAB — PSA: PROSTATE SPECIFIC AG, SERUM: 0.7 ng/mL (ref 0.0–4.0)

## 2016-09-08 ENCOUNTER — Encounter: Payer: Self-pay | Admitting: Family Medicine

## 2016-09-08 ENCOUNTER — Telehealth: Payer: Self-pay | Admitting: Family Medicine

## 2016-09-08 NOTE — Telephone Encounter (Signed)
Phone call Discussed with patient elevated hemoglobin and hematocrit patient using testosterone cream. Dr. Eliberto Ivory is prescribing medication patient hasn't been back for about 6 months. Patient has had phlebotomies before because of elevated hemoglobin. Discussed with patient he will had back to Dr. Eliberto Ivory for further evaluation and phlebotomy as appropriate.

## 2016-09-21 ENCOUNTER — Ambulatory Visit
Admission: RE | Admit: 2016-09-21 | Discharge: 2016-09-21 | Disposition: A | Discharge status: Home / Self Care | Payer: No Typology Code available for payment source | Source: Ambulatory Visit | Attending: Urology | Admitting: Urology

## 2016-09-21 DIAGNOSIS — D751 Secondary polycythemia: Secondary | ICD-10-CM | POA: Insufficient documentation

## 2016-09-28 ENCOUNTER — Ambulatory Visit
Admission: RE | Admit: 2016-09-28 | Discharge: 2016-09-28 | Disposition: A | Discharge status: Home / Self Care | Payer: No Typology Code available for payment source | Source: Ambulatory Visit | Attending: Urology | Admitting: Urology

## 2016-09-28 DIAGNOSIS — I10 Essential (primary) hypertension: Secondary | ICD-10-CM | POA: Insufficient documentation

## 2016-09-28 LAB — CBC
HEMATOCRIT: 51.9 % (ref 40.0–52.0)
HEMOGLOBIN: 17.8 g/dL (ref 13.0–18.0)
MCH: 32.5 pg (ref 26.0–34.0)
MCHC: 34.3 g/dL (ref 32.0–36.0)
MCV: 94.8 fL (ref 80.0–100.0)
Platelets: 219 10*3/uL (ref 150–440)
RBC: 5.48 MIL/uL (ref 4.40–5.90)
RDW: 13.5 % (ref 11.5–14.5)
WBC: 11.8 10*3/uL — ABNORMAL HIGH (ref 3.8–10.6)

## 2016-10-04 ENCOUNTER — Encounter: Payer: Self-pay | Admitting: Gastroenterology

## 2017-01-05 ENCOUNTER — Other Ambulatory Visit: Payer: Self-pay | Admitting: Family Medicine

## 2017-01-05 NOTE — Telephone Encounter (Signed)
Routing to provider. Needs new rx, his pharmacy closed.

## 2017-01-31 ENCOUNTER — Other Ambulatory Visit: Payer: Self-pay | Admitting: Family Medicine

## 2017-02-22 ENCOUNTER — Ambulatory Visit (INDEPENDENT_AMBULATORY_CARE_PROVIDER_SITE_OTHER): Payer: PRIVATE HEALTH INSURANCE | Admitting: Unknown Physician Specialty

## 2017-02-22 ENCOUNTER — Encounter: Payer: Self-pay | Admitting: Unknown Physician Specialty

## 2017-02-22 VITALS — BP 132/89 | HR 88 | Temp 98.5°F | Wt 196.2 lb

## 2017-02-22 DIAGNOSIS — Z024 Encounter for examination for driving license: Secondary | ICD-10-CM

## 2017-02-22 NOTE — Progress Notes (Signed)
   BP 132/89   Pulse 88   Temp 98.5 F (36.9 C) (Oral)   Wt 196 lb 3.2 oz (89 kg)   SpO2 96%   BMI 28.15 kg/m    Subjective:    Patient ID: Jorge Brown, male    DOB: [birth year], 56 y.o.   MRN: [id number]  HPI: Jorge Brown is a 56 y.o. male  Chief Complaint  Patient presents with  . DOt Physical   DOT physical   Relevant past medical, surgical, family and social history reviewed and updated as indicated. Interim medical history since our last visit reviewed. Allergies and medications reviewed and updated.  Review of Systems  Per HPI unless specifically indicated above     Objective:    BP 132/89   Pulse 88   Temp 98.5 F (36.9 C) (Oral)   Wt 196 lb 3.2 oz (89 kg)   SpO2 96%   BMI 28.15 kg/m   Wt Readings from Last 3 Encounters:  02/22/17 196 lb 3.2 oz (89 kg)  09/21/16 191 lb (86.6 kg)  09/02/16 191 lb (86.6 kg)    Physical Exam  See form  Results for orders placed or performed during the hospital encounter of 09/28/16  CBC  Result Value Ref Range   WBC 11.8 (H) 3.8 - 10.6 K/uL   RBC 5.48 4.40 - 5.90 MIL/uL   Hemoglobin 17.8 13.0 - 18.0 g/dL   HCT 51.9 40.0 - 52.0 %   MCV 94.8 80.0 - 100.0 fL   MCH 32.5 26.0 - 34.0 pg   MCHC 34.3 32.0 - 36.0 g/dL   RDW 13.5 11.5 - 14.5 %   Platelets 219 150 - 440 K/uL      Assessment & Plan:   Problem List Items Addressed This Visit    None    Visit Diagnoses    Encounter for commercial driver medical examination (CDME)    -  Primary      OK for 1 year  Follow up plan: No Follow-up on file.

## 2017-02-25 ENCOUNTER — Other Ambulatory Visit: Payer: Self-pay | Admitting: Family Medicine

## 2017-03-07 ENCOUNTER — Encounter: Payer: Self-pay | Admitting: Family Medicine

## 2017-03-07 ENCOUNTER — Ambulatory Visit (INDEPENDENT_AMBULATORY_CARE_PROVIDER_SITE_OTHER): Payer: PRIVATE HEALTH INSURANCE | Admitting: Family Medicine

## 2017-03-07 DIAGNOSIS — I10 Essential (primary) hypertension: Secondary | ICD-10-CM | POA: Diagnosis not present

## 2017-03-07 DIAGNOSIS — E291 Testicular hypofunction: Secondary | ICD-10-CM | POA: Diagnosis not present

## 2017-03-07 MED ORDER — AMLODIPINE BESYLATE 5 MG PO TABS: 5.0000 mg | ORAL_TABLET | Freq: Every day | ORAL | 3 refills | Status: DC

## 2017-03-07 NOTE — Assessment & Plan Note (Signed)
Discuss hypertension good control but need to change hydrochlorothiazide due to the possibility of increased risk of skin cancers. Patient will finish current bottle and then rechecka month after starting amlodipine 5 mg once a day. This will be about 2 months from now.

## 2017-03-07 NOTE — Progress Notes (Signed)
   BP (!) 141/93   Pulse 71   Wt 196 lb (88.9 kg)   SpO2 99%   BMI 28.12 kg/m    Subjective:    Patient ID: Jorge Brown, male    DOB: Aug 05, 1960, 56 y.o.   MRN: [id number]  HPI: MUHANNAD BIGNELL is a 56 y.o. male  Chief Complaint  Patient presents with  . Follow-up   Patient follow-up doing well blood pressure-wise no complaints. Taking medications faithfully without side effects. Has recurrent skin cancers and dermatologist is concerned about hydrochlorothiazide increasing risk of some burning and possibility of skin cancers They are recommending a change of medication.  Relevant past medical, surgical, family and social history reviewed and updated as indicated. Interim medical history since our last visit reviewed. Allergies and medications reviewed and updated.  Review of Systems  Constitutional: Negative.   Respiratory: Negative.   Cardiovascular: Negative.     Per HPI unless specifically indicated above     Objective:    BP (!) 141/93   Pulse 71   Wt 196 lb (88.9 kg)   SpO2 99%   BMI 28.12 kg/m   Wt Readings from Last 3 Encounters:  03/07/17 196 lb (88.9 kg)  02/22/17 196 lb 3.2 oz (89 kg)  09/21/16 191 lb (86.6 kg)    Physical Exam  Constitutional: He is oriented to person, place, and time. He appears well-developed and well-nourished.  HENT:  Head: Normocephalic and atraumatic.  Eyes: Conjunctivae and EOM are normal.  Neck: Normal range of motion.  Cardiovascular: Normal rate, regular rhythm and normal heart sounds.  Pulmonary/Chest: Effort normal and breath sounds normal.  Musculoskeletal: Normal range of motion.  Neurological: He is alert and oriented to person, place, and time.  Skin: No erythema.  Psychiatric: He has a normal mood and affect. His behavior is normal. Judgment and thought content normal.    Results for orders placed or performed during the hospital encounter of 09/28/16  CBC  Result Value Ref Range   WBC 11.8 (H) 3.8 - 10.6  K/uL   RBC 5.48 4.40 - 5.90 MIL/uL   Hemoglobin 17.8 13.0 - 18.0 g/dL   HCT 51.9 40.0 - 52.0 %   MCV 94.8 80.0 - 100.0 fL   MCH 32.5 26.0 - 34.0 pg   MCHC 34.3 32.0 - 36.0 g/dL   RDW 13.5 11.5 - 14.5 %   Platelets 219 150 - 440 K/uL      Assessment & Plan:   Problem List Items Addressed This Visit      Cardiovascular and Mediastinum   Hypertension    Discuss hypertension good control but need to change hydrochlorothiazide due to the possibility of increased risk of skin cancers. Patient will finish current bottle and then rechecka month after starting amlodipine 5 mg once a day. This will be about 2 months from now.      Relevant Medications   amLODipine (NORVASC) 5 MG tablet     Endocrine   Testicular hypofunction    Followed by urology          Follow up plan: Return in about 2 months (around 05/08/2017) for BMP.

## 2017-03-07 NOTE — Assessment & Plan Note (Signed)
Followed by urology.   

## 2017-03-23 ENCOUNTER — Telehealth: Payer: Self-pay | Admitting: Gastroenterology

## 2017-03-23 NOTE — Telephone Encounter (Signed)
Please forward this to [facility]. I'm not sure who this is. I don't do anything with the bills and coding.

## 2017-03-23 NOTE — Telephone Encounter (Signed)
Patient's wife called you to discuss the colonoscopy bill. She stated you were going to talk to someone to discuss the coding so it would be paid. Please call Colletta [place] back

## 2017-04-14 ENCOUNTER — Telehealth: Payer: Self-pay | Admitting: Family Medicine

## 2017-04-14 NOTE — Telephone Encounter (Signed)
It was done 02/22/2017 in chart  Thanks

## 2017-04-14 NOTE — Telephone Encounter (Signed)
Copied from [place] [id number]. Topic: Quick Communication - See Telephone Encounter >> Apr 14, 2017 10:42 AM Burnis Medin, NT wrote: CRM for notification. See Telephone encounter for: Pt wife called and said her husband got a physical and the doctor was suppose to send a copy over to the [employer] and hasn't been done yet. Wife said it has to be done today. Wife ask if it can be emailed  to [email or web address]. In the subject put patient's license number which is [phone] and last name. Wife said Kathrine Haddock did the exam.  04/14/17.

## 2017-04-15 ENCOUNTER — Telehealth: Payer: Self-pay | Admitting: *Deleted

## 2017-04-15 NOTE — Telephone Encounter (Signed)
Patients wife called back again regarding the colonoscopy he had done. She has talked with their insurance company and for insurance to pay, the colonoscopy needs to be the main procedure followed by the other procedures he had done that same day.

## 2017-05-01 NOTE — Telephone Encounter (Signed)
[name],  Has this been taken care of?  I think this is the patient that we discussed but want to be sure... Thanks, Alwyn Ren

## 2017-05-02 NOTE — Telephone Encounter (Signed)
Spoke with patient. He is going to call to find out and if not he will call us back to let us know.  Thanks

## 2017-05-18 ENCOUNTER — Ambulatory Visit (INDEPENDENT_AMBULATORY_CARE_PROVIDER_SITE_OTHER): Payer: PRIVATE HEALTH INSURANCE | Admitting: Family Medicine

## 2017-05-18 ENCOUNTER — Encounter: Payer: Self-pay | Admitting: Family Medicine

## 2017-05-18 VITALS — BP 157/100 | HR 71 | Wt 196.0 lb

## 2017-05-18 DIAGNOSIS — E291 Testicular hypofunction: Secondary | ICD-10-CM | POA: Diagnosis not present

## 2017-05-18 DIAGNOSIS — I10 Essential (primary) hypertension: Secondary | ICD-10-CM

## 2017-05-18 MED ORDER — AMLODIPINE BESYLATE 10 MG PO TABS: 10.0000 mg | ORAL_TABLET | Freq: Every day | ORAL | 3 refills | Status: DC

## 2017-05-18 NOTE — Progress Notes (Signed)
   BP (!) 157/100   Pulse 71   Wt 196 lb (88.9 kg)   SpO2 99%   BMI 28.12 kg/m    Subjective:    Patient ID: Jorge Brown, male    DOB: 13-Jul-1960, 57 y.o.   MRN: [id number]  HPI: Jorge Brown is a 57 y.o. male  Hypertension check doing well no complaints from medication.  Taking amlodipine 5 mg a day without side effects no edema.  Switch from hydrochlorothiazide went well.  Face is recovered from skin surgery. Doing okay with testosterone replacement  Relevant past medical, surgical, family and social history reviewed and updated as indicated. Interim medical history since our last visit reviewed. Allergies and medications reviewed and updated.  Review of Systems  Constitutional: Negative.   Respiratory: Negative.   Cardiovascular: Negative.     Per HPI unless specifically indicated above     Objective:    BP (!) 157/100   Pulse 71   Wt 196 lb (88.9 kg)   SpO2 99%   BMI 28.12 kg/m   Wt Readings from Last 3 Encounters:  05/18/17 196 lb (88.9 kg)  03/07/17 196 lb (88.9 kg)  02/22/17 196 lb 3.2 oz (89 kg)    Physical Exam  Constitutional: He is oriented to person, place, and time. He appears well-developed and well-nourished.  HENT:  Head: Normocephalic and atraumatic.  Eyes: Conjunctivae and EOM are normal.  Neck: Normal range of motion.  Cardiovascular: Normal rate, regular rhythm and normal heart sounds.  Pulmonary/Chest: Effort normal and breath sounds normal.  Musculoskeletal: Normal range of motion.  Neurological: He is alert and oriented to person, place, and time.  Skin: No erythema.  Psychiatric: He has a normal mood and affect. His behavior is normal. Judgment and thought content normal.    Results for orders placed or performed during the hospital encounter of 09/28/16  CBC  Result Value Ref Range   WBC 11.8 (H) 3.8 - 10.6 K/uL   RBC 5.48 4.40 - 5.90 MIL/uL   Hemoglobin 17.8 13.0 - 18.0 g/dL   HCT 51.9 40.0 - 52.0 %   MCV 94.8 80.0 - 100.0  fL   MCH 32.5 26.0 - 34.0 pg   MCHC 34.3 32.0 - 36.0 g/dL   RDW 13.5 11.5 - 14.5 %   Platelets 219 150 - 440 K/uL      Assessment & Plan:   Problem List Items Addressed This Visit      Cardiovascular and Mediastinum   Hypertension - Primary    Hypertension poor control. We will continue off hydrochlorothiazide increase amlodipine from 5 mg increased to 10 mg a day. Follow-up blood pressure 1-2 months.      Relevant Medications   amLODipine (NORVASC) 10 MG tablet   Other Relevant Orders   Basic metabolic panel     Endocrine   Testicular hypofunction    Doing well with blood work no complaints          Follow up plan: Return in about 2 months (around 07/16/2017).

## 2017-05-18 NOTE — Assessment & Plan Note (Signed)
Doing well with blood work no complaints

## 2017-05-18 NOTE — Assessment & Plan Note (Signed)
Hypertension poor control. We will continue off hydrochlorothiazide increase amlodipine from 5 mg increased to 10 mg a day. Follow-up blood pressure 1-2 months.

## 2017-05-19 ENCOUNTER — Encounter: Payer: Self-pay | Admitting: Family Medicine

## 2017-05-19 LAB — BASIC METABOLIC PANEL
BUN / CREAT RATIO: 14 (ref 9–20)
BUN: 15 mg/dL (ref 6–24)
CALCIUM: 9.5 mg/dL (ref 8.7–10.2)
CO2: 23 mmol/L (ref 20–29)
Chloride: 103 mmol/L (ref 96–106)
Creatinine, Ser: 1.05 mg/dL (ref 0.76–1.27)
GFR, EST AFRICAN AMERICAN: 93 mL/min/{1.73_m2} (ref >59)
GFR, EST NON AFRICAN AMERICAN: 80 mL/min/{1.73_m2} (ref >59)
Glucose: 96 mg/dL (ref 65–99)
POTASSIUM: 4.4 mmol/L (ref 3.5–5.2)
SODIUM: 142 mmol/L (ref 134–144)

## 2017-06-30 ENCOUNTER — Ambulatory Visit: Payer: PRIVATE HEALTH INSURANCE | Admitting: Family Medicine

## 2017-06-30 ENCOUNTER — Encounter: Payer: Self-pay | Admitting: Family Medicine

## 2017-06-30 DIAGNOSIS — I10 Essential (primary) hypertension: Secondary | ICD-10-CM | POA: Diagnosis not present

## 2017-06-30 MED ORDER — BENAZEPRIL HCL 20 MG PO TABS: 20.0000 mg | ORAL_TABLET | Freq: Every day | ORAL | 2 refills | Status: DC

## 2017-06-30 NOTE — Assessment & Plan Note (Signed)
We will continue amlodipine 10 mg at benazepril 20 mg recheck in next office visit

## 2017-06-30 NOTE — Progress Notes (Signed)
   BP 134/89   Pulse 70   Ht 5\' 10"  (1.778 m)   Wt 196 lb (88.9 kg)   SpO2 98%   BMI 28.12 kg/m    Subjective:    Patient ID: Jorge Brown, male    DOB: 1960/09/28, 57 y.o.   MRN: [id number]  HPI: KARRIEM MUENCH is a 57 y.o. male  Chief Complaint  Patient presents with  . Follow-up  . Hypertension   Patient recheck elevated blood pressure on review patient with misunderstanding on medication taking amlodipine 10 mg only not benazepril. Blood pressure elevated and elevated at home on home readings.  Taking reflux medication occasionally otherwise doing well.  Relevant past medical, surgical, family and social history reviewed and updated as indicated. Interim medical history since our last visit reviewed. Allergies and medications reviewed and updated.  Review of Systems  Constitutional: Negative.   Respiratory: Negative.   Cardiovascular: Negative.     Per HPI unless specifically indicated above     Objective:    BP 134/89   Pulse 70   Ht 5\' 10"  (1.778 m)   Wt 196 lb (88.9 kg)   SpO2 98%   BMI 28.12 kg/m   Wt Readings from Last 3 Encounters:  06/30/17 196 lb (88.9 kg)  05/18/17 196 lb (88.9 kg)  03/07/17 196 lb (88.9 kg)    Physical Exam  Constitutional: He is oriented to person, place, and time. He appears well-developed and well-nourished.  HENT:  Head: Normocephalic and atraumatic.  Eyes: Conjunctivae and EOM are normal.  Neck: Normal range of motion.  Cardiovascular: Normal rate, regular rhythm and normal heart sounds.  Pulmonary/Chest: Effort normal and breath sounds normal.  Musculoskeletal: Normal range of motion.  Neurological: He is alert and oriented to person, place, and time.  Skin: No erythema.  Psychiatric: He has a normal mood and affect. His behavior is normal. Judgment and thought content normal.    Results for orders placed or performed in visit on 32/20/25  Basic metabolic panel  Result Value Ref Range   Glucose 96 65 - 99 mg/dL    BUN 15 6 - 24 mg/dL   Creatinine, Ser 1.05 0.76 - 1.27 mg/dL   GFR calc non Af Amer 80 >59 mL/min/1.73   GFR calc Af Amer 93 >59 mL/min/1.73   BUN/Creatinine Ratio 14 9 - 20   Sodium 142 134 - 144 mmol/L   Potassium 4.4 3.5 - 5.2 mmol/L   Chloride 103 96 - 106 mmol/L   CO2 23 20 - 29 mmol/L   Calcium 9.5 8.7 - 10.2 mg/dL      Assessment & Plan:   Problem List Items Addressed This Visit      Cardiovascular and Mediastinum   Hypertension    We will continue amlodipine 10 mg at benazepril 20 mg recheck in next office visit      Relevant Medications   benazepril (LOTENSIN) 20 MG tablet       Follow up plan: Return for Physical Exam, May.

## 2017-09-15 ENCOUNTER — Encounter: Payer: PRIVATE HEALTH INSURANCE | Admitting: Family Medicine

## 2017-09-18 ENCOUNTER — Other Ambulatory Visit: Payer: Self-pay | Admitting: Family Medicine

## 2017-09-20 NOTE — Telephone Encounter (Signed)
apt 

## 2017-09-20 NOTE — Telephone Encounter (Signed)
Amlodipine 10 mg refill request  LOV 06/30/17 with Dr. Jeananne Rama   Was for F/U 09/15/17 cancelled by pt.   No future appts noted  [facility] [phone] [address], [place] - [address]

## 2017-10-12 ENCOUNTER — Encounter: Payer: Self-pay | Admitting: Family Medicine

## 2017-10-12 ENCOUNTER — Ambulatory Visit (INDEPENDENT_AMBULATORY_CARE_PROVIDER_SITE_OTHER): Payer: PRIVATE HEALTH INSURANCE | Admitting: Family Medicine

## 2017-10-12 VITALS — BP 136/80 | HR 68 | Ht 69.09 in | Wt 194.0 lb

## 2017-10-12 DIAGNOSIS — E291 Testicular hypofunction: Secondary | ICD-10-CM

## 2017-10-12 DIAGNOSIS — Z0000 Encounter for general adult medical examination without abnormal findings: Secondary | ICD-10-CM

## 2017-10-12 DIAGNOSIS — Z13220 Encounter for screening for lipoid disorders: Secondary | ICD-10-CM | POA: Diagnosis not present

## 2017-10-12 DIAGNOSIS — Z1329 Encounter for screening for other suspected endocrine disorder: Secondary | ICD-10-CM | POA: Diagnosis not present

## 2017-10-12 DIAGNOSIS — Z0001 Encounter for general adult medical examination with abnormal findings: Secondary | ICD-10-CM | POA: Diagnosis not present

## 2017-10-12 DIAGNOSIS — I10 Essential (primary) hypertension: Secondary | ICD-10-CM | POA: Diagnosis not present

## 2017-10-12 DIAGNOSIS — C61 Malignant neoplasm of prostate: Secondary | ICD-10-CM | POA: Diagnosis not present

## 2017-10-12 DIAGNOSIS — Z114 Encounter for screening for human immunodeficiency virus [HIV]: Secondary | ICD-10-CM

## 2017-10-12 DIAGNOSIS — Z23 Encounter for immunization: Secondary | ICD-10-CM

## 2017-10-12 LAB — URINALYSIS, ROUTINE W REFLEX MICROSCOPIC
Bilirubin, UA: NEGATIVE
Glucose, UA: NEGATIVE
KETONES UA: NEGATIVE
LEUKOCYTES UA: NEGATIVE
NITRITE UA: NEGATIVE
PH UA: 7 (ref 5.0–7.5)
Protein, UA: NEGATIVE
RBC, UA: NEGATIVE
Specific Gravity, UA: 1.015 (ref 1.005–1.030)
Urobilinogen, Ur: 0.2 mg/dL (ref 0.2–1.0)

## 2017-10-12 MED ORDER — BENAZEPRIL HCL 20 MG PO TABS: 20.0000 mg | ORAL_TABLET | Freq: Every day | ORAL | 4 refills | Status: DC

## 2017-10-12 MED ORDER — AMLODIPINE BESYLATE 10 MG PO TABS: 10.0000 mg | ORAL_TABLET | Freq: Every day | ORAL | 4 refills | Status: DC

## 2017-10-12 NOTE — Assessment & Plan Note (Signed)
Followed by urology.   

## 2017-10-12 NOTE — Progress Notes (Signed)
BP 136/80 (BP Location: Left Arm)   Pulse 68   Ht 5' 9.09" (1.755 m)   Wt 194 lb (88 kg)   SpO2 98%   BMI 28.57 kg/m    Subjective:    Patient ID: Jorge Brown, male    DOB: 09/30/1960, 57 y.o.   MRN: [id number]  HPI: Jorge Brown is a 57 y.o. male  Chief Complaint  Patient presents with  . Annual Exam  Patient with probable ruptured right triceps tendon was pushing on the edge of his boat to get in and felt a pop snap and immediate weakness now has marked bruising and difficulty with full arm extension with resistance. Has an appointment with orthopedics tomorrow. Blood pressure doing well without issues or concerns takes medications faithfully without problems.   Relevant past medical, surgical, family and social history reviewed and updated as indicated. Interim medical history since our last visit reviewed. Allergies and medications reviewed and updated.  Review of Systems  Constitutional: Negative.   HENT: Negative.   Eyes: Negative.   Respiratory: Negative.   Cardiovascular: Negative.   Gastrointestinal: Negative.   Endocrine: Negative.   Genitourinary: Negative.   Musculoskeletal: Negative.   Skin: Negative.   Allergic/Immunologic: Negative.   Neurological: Negative.   Hematological: Negative.   Psychiatric/Behavioral: Negative.     Per HPI unless specifically indicated above     Objective:    BP 136/80 (BP Location: Left Arm)   Pulse 68   Ht 5' 9.09" (1.755 m)   Wt 194 lb (88 kg)   SpO2 98%   BMI 28.57 kg/m   Wt Readings from Last 3 Encounters:  10/12/17 194 lb (88 kg)  06/30/17 196 lb (88.9 kg)  05/18/17 196 lb (88.9 kg)    Physical Exam  Constitutional: He is oriented to person, place, and time. He appears well-developed and well-nourished.  HENT:  Head: Normocephalic.  Right Ear: External ear normal.  Left Ear: External ear normal.  Nose: Nose normal.  Eyes: Pupils are equal, round, and reactive to light. Conjunctivae and EOM are  normal.  Neck: Normal range of motion. Neck supple. No thyromegaly present.  Cardiovascular: Normal rate, regular rhythm, normal heart sounds and intact distal pulses.  Pulmonary/Chest: Effort normal and breath sounds normal.  Abdominal: Soft. Bowel sounds are normal. There is no splenomegaly or hepatomegaly.  Genitourinary: Rectum normal, prostate normal and penis normal.  Musculoskeletal: Normal range of motion.  Lymphadenopathy:    He has no cervical adenopathy.  Neurological: He is alert and oriented to person, place, and time. He has normal reflexes.  Skin: Skin is warm and dry.  Psychiatric: He has a normal mood and affect. His behavior is normal. Judgment and thought content normal.    Results for orders placed or performed in visit on 73/53/29  Basic metabolic panel  Result Value Ref Range   Glucose 96 65 - 99 mg/dL   BUN 15 6 - 24 mg/dL   Creatinine, Ser 1.05 0.76 - 1.27 mg/dL   GFR calc non Af Amer 80 >59 mL/min/1.73   GFR calc Af Amer 93 >59 mL/min/1.73   BUN/Creatinine Ratio 14 9 - 20   Sodium 142 134 - 144 mmol/L   Potassium 4.4 3.5 - 5.2 mmol/L   Chloride 103 96 - 106 mmol/L   CO2 23 20 - 29 mmol/L   Calcium 9.5 8.7 - 10.2 mg/dL      Assessment & Plan:   Problem List Items Addressed This Visit  Cardiovascular and Mediastinum   Hypertension - Primary    The current medical regimen is effective;  continue present plan and medications.       Relevant Medications   benazepril (LOTENSIN) 20 MG tablet   amLODipine (NORVASC) 10 MG tablet   Other Relevant Orders   CBC with Differential/Platelet   Lipid panel   Urinalysis, Routine w reflex microscopic     Endocrine   Testicular hypofunction    Followed by urology      Relevant Orders   PSA    Other Visit Diagnoses    Routine general medical examination at a health care facility       Relevant Orders   CBC with Differential/Platelet   Comprehensive metabolic panel   Lipid panel   PSA   TSH    Urinalysis, Routine w reflex microscopic   Prostate cancer ([place])       Relevant Orders   PSA   Screening cholesterol level       Relevant Orders   Lipid panel   Thyroid disorder screen       Relevant Orders   TSH   Encounter for screening for HIV       Relevant Orders   HIV antibody (with reflex)   Need for Tdap vaccination           Follow up plan: Return in about 6 months (around 04/14/2018) for BMP.

## 2017-10-12 NOTE — Assessment & Plan Note (Signed)
The current medical regimen is effective;  continue present plan and medications.  

## 2017-10-13 ENCOUNTER — Encounter: Payer: Self-pay | Admitting: Family Medicine

## 2017-10-13 ENCOUNTER — Other Ambulatory Visit: Payer: Self-pay | Admitting: Orthopedic Surgery

## 2017-10-13 DIAGNOSIS — M25521 Pain in right elbow: Secondary | ICD-10-CM

## 2017-10-13 LAB — COMPREHENSIVE METABOLIC PANEL
ALT: 24 IU/L (ref 0–44)
AST: 22 IU/L (ref 0–40)
Albumin/Globulin Ratio: 1.8 (ref 1.2–2.2)
Albumin: 4.2 g/dL (ref 3.5–5.5)
Alkaline Phosphatase: 63 IU/L (ref 39–117)
BUN / CREAT RATIO: 17 (ref 9–20)
BUN: 16 mg/dL (ref 6–24)
Bilirubin Total: 1 mg/dL (ref 0.0–1.2)
CALCIUM: 9.4 mg/dL (ref 8.7–10.2)
CO2: 25 mmol/L (ref 20–29)
CREATININE: 0.96 mg/dL (ref 0.76–1.27)
Chloride: 103 mmol/L (ref 96–106)
GFR calc Af Amer: 102 mL/min/{1.73_m2} (ref >59)
GFR calc non Af Amer: 89 mL/min/{1.73_m2} (ref >59)
Globulin, Total: 2.4 g/dL (ref 1.5–4.5)
Glucose: 78 mg/dL (ref 65–99)
Potassium: 3.9 mmol/L (ref 3.5–5.2)
SODIUM: 142 mmol/L (ref 134–144)
TOTAL PROTEIN: 6.6 g/dL (ref 6.0–8.5)

## 2017-10-13 LAB — CBC WITH DIFFERENTIAL/PLATELET
Basophils Absolute: 0 10*3/uL (ref 0.0–0.2)
Basos: 0 %
EOS (ABSOLUTE): 0 10*3/uL (ref 0.0–0.4)
Eos: 1 %
HEMATOCRIT: 53.4 % — AB (ref 37.5–51.0)
Hemoglobin: 17.9 g/dL — ABNORMAL HIGH (ref 13.0–17.7)
IMMATURE GRANS (ABS): 0 10*3/uL (ref 0.0–0.1)
IMMATURE GRANULOCYTES: 0 %
LYMPHS: 27 %
Lymphocytes Absolute: 2.1 10*3/uL (ref 0.7–3.1)
MCH: 32.5 pg (ref 26.6–33.0)
MCHC: 33.5 g/dL (ref 31.5–35.7)
MCV: 97 fL (ref 79–97)
MONOS ABS: 0.8 10*3/uL (ref 0.1–0.9)
Monocytes: 10 %
NEUTROS PCT: 62 %
Neutrophils Absolute: 4.8 10*3/uL (ref 1.4–7.0)
PLATELETS: 205 10*3/uL (ref 150–450)
RBC: 5.51 x10E6/uL (ref 4.14–5.80)
RDW: 13 % (ref 12.3–15.4)
WBC: 7.7 10*3/uL (ref 3.4–10.8)

## 2017-10-13 LAB — LIPID PANEL
CHOLESTEROL TOTAL: 172 mg/dL (ref 100–199)
Chol/HDL Ratio: 3.7 ratio (ref 0.0–5.0)
HDL: 47 mg/dL (ref >39)
LDL CALC: 89 mg/dL (ref 0–99)
Triglycerides: 179 mg/dL — ABNORMAL HIGH (ref 0–149)
VLDL Cholesterol Cal: 36 mg/dL (ref 5–40)

## 2017-10-13 LAB — PSA: Prostate Specific Ag, Serum: 0.8 ng/mL (ref 0.0–4.0)

## 2017-10-13 LAB — TSH: TSH: 0.985 u[IU]/mL (ref 0.450–4.500)

## 2017-10-13 LAB — HIV ANTIBODY (ROUTINE TESTING W REFLEX): HIV SCREEN 4TH GENERATION: NONREACTIVE

## 2017-10-16 ENCOUNTER — Ambulatory Visit
Admission: RE | Admit: 2017-10-16 | Discharge: 2017-10-16 | Disposition: A | Discharge status: Home / Self Care | Payer: PRIVATE HEALTH INSURANCE | Source: Ambulatory Visit | Attending: Orthopedic Surgery | Admitting: Orthopedic Surgery

## 2017-10-16 DIAGNOSIS — M25521 Pain in right elbow: Secondary | ICD-10-CM

## 2017-12-02 ENCOUNTER — Telehealth: Payer: Self-pay | Admitting: Family Medicine

## 2017-12-02 NOTE — Telephone Encounter (Unsigned)
Copied from [place] [id number]. Topic: Quick Communication - Rx Refill/Question >> Dec 02, 2017  9:47 AM Judyann Munson wrote: Medication: amLODipine (NORVASC) 10 MG tablet   Has the patient contacted their pharmacy? Yes   Preferred Pharmacy (with phone number or street name): [id number] - [address], [place] [phone] (Phone) [phone] (Fax)  Patient stated the prescription sent in July is not at the pharmacy

## 2017-12-06 ENCOUNTER — Other Ambulatory Visit: Payer: Self-pay | Admitting: Family Medicine

## 2017-12-06 MED ORDER — AMLODIPINE BESYLATE 10 MG PO TABS: 10.0000 mg | ORAL_TABLET | Freq: Every day | ORAL | 4 refills | Status: DC

## 2018-01-20 ENCOUNTER — Encounter: Payer: PRIVATE HEALTH INSURANCE | Admitting: Unknown Physician Specialty

## 2018-01-27 ENCOUNTER — Ambulatory Visit (INDEPENDENT_AMBULATORY_CARE_PROVIDER_SITE_OTHER): Payer: Self-pay | Admitting: Unknown Physician Specialty

## 2018-01-27 ENCOUNTER — Encounter: Payer: Self-pay | Admitting: Unknown Physician Specialty

## 2018-01-27 VITALS — BP 137/81 | HR 85 | Ht 69.37 in | Wt 195.2 lb

## 2018-01-27 DIAGNOSIS — Z024 Encounter for examination for driving license: Secondary | ICD-10-CM

## 2018-01-27 NOTE — Progress Notes (Signed)
BP 137/81 (BP Location: Left Arm, Patient Position: Sitting, Cuff Size: Normal)   Pulse 85   Ht 5' 9.37" (1.762 m)   Wt 195 lb 4 oz (88.6 kg)   SpO2 97%   BMI 28.53 kg/m    Subjective:    Patient ID: Jorge Brown, male    DOB: 1960/04/22, 57 y.o.   MRN: [id number]  HPI: DREAM NODAL is a 57 y.o. male  Chief Complaint  Patient presents with  . DOT   Pt is her for DOT physical.  Pt   See form Relevant past medical, surgical, family and social history reviewed and updated as indicated. Interim medical history since our last visit reviewed. Allergies and medications reviewed and updated.  Review of Systems  Per HPI unless specifically indicated above     Objective:    BP 137/81 (BP Location: Left Arm, Patient Position: Sitting, Cuff Size: Normal)   Pulse 85   Ht 5' 9.37" (1.762 m)   Wt 195 lb 4 oz (88.6 kg)   SpO2 97%   BMI 28.53 kg/m   Wt Readings from Last 3 Encounters:  01/27/18 195 lb 4 oz (88.6 kg)  10/12/17 194 lb (88 kg)  06/30/17 196 lb (88.9 kg)    Physical Exam  See form Results for orders placed or performed in visit on 10/12/17  CBC with Differential/Platelet  Result Value Ref Range   WBC 7.7 3.4 - 10.8 x10E3/uL   RBC 5.51 4.14 - 5.80 x10E6/uL   Hemoglobin 17.9 (H) 13.0 - 17.7 g/dL   Hematocrit 53.4 (H) 37.5 - 51.0 %   MCV 97 79 - 97 fL   MCH 32.5 26.6 - 33.0 pg   MCHC 33.5 31.5 - 35.7 g/dL   RDW 13.0 12.3 - 15.4 %   Platelets 205 150 - 450 x10E3/uL   Neutrophils 62 Not Estab. %   Lymphs 27 Not Estab. %   Monocytes 10 Not Estab. %   Eos 1 Not Estab. %   Basos 0 Not Estab. %   Neutrophils Absolute 4.8 1.4 - 7.0 x10E3/uL   Lymphocytes Absolute 2.1 0.7 - 3.1 x10E3/uL   Monocytes Absolute 0.8 0.1 - 0.9 x10E3/uL   EOS (ABSOLUTE) 0.0 0.0 - 0.4 x10E3/uL   Basophils Absolute 0.0 0.0 - 0.2 x10E3/uL   Immature Granulocytes 0 Not Estab. %   Immature Grans (Abs) 0.0 0.0 - 0.1 x10E3/uL  Comprehensive metabolic panel  Result Value Ref Range   Glucose 78 65 - 99 mg/dL   BUN 16 6 - 24 mg/dL   Creatinine, Ser 0.96 0.76 - 1.27 mg/dL   GFR calc non Af Amer 89 >59 mL/min/1.73   GFR calc Af Amer 102 >59 mL/min/1.73   BUN/Creatinine Ratio 17 9 - 20   Sodium 142 134 - 144 mmol/L   Potassium 3.9 3.5 - 5.2 mmol/L   Chloride 103 96 - 106 mmol/L   CO2 25 20 - 29 mmol/L   Calcium 9.4 8.7 - 10.2 mg/dL   Total Protein 6.6 6.0 - 8.5 g/dL   Albumin 4.2 3.5 - 5.5 g/dL   Globulin, Total 2.4 1.5 - 4.5 g/dL   Albumin/Globulin Ratio 1.8 1.2 - 2.2   Bilirubin Total 1.0 0.0 - 1.2 mg/dL   Alkaline Phosphatase 63 39 - 117 IU/L   AST 22 0 - 40 IU/L   ALT 24 0 - 44 IU/L  Lipid panel  Result Value Ref Range   Cholesterol, Total 172 100 - 199 mg/dL  Triglycerides 179 (H) 0 - 149 mg/dL   HDL 47 >39 mg/dL   VLDL Cholesterol Cal 36 5 - 40 mg/dL   LDL Calculated 89 0 - 99 mg/dL   Chol/HDL Ratio 3.7 0.0 - 5.0 ratio  PSA  Result Value Ref Range   Prostate Specific Ag, Serum 0.8 0.0 - 4.0 ng/mL  TSH  Result Value Ref Range   TSH 0.985 0.450 - 4.500 uIU/mL  Urinalysis, Routine w reflex microscopic  Result Value Ref Range   Specific Gravity, UA 1.015 1.005 - 1.030   pH, UA 7.0 5.0 - 7.5   Color, UA Yellow Yellow   Appearance Ur Clear Clear   Leukocytes, UA Negative Negative   Protein, UA Negative Negative/Trace   Glucose, UA Negative Negative   Ketones, UA Negative Negative   RBC, UA Negative Negative   Bilirubin, UA Negative Negative   Urobilinogen, Ur 0.2 0.2 - 1.0 mg/dL   Nitrite, UA Negative Negative  HIV antibody (with reflex)  Result Value Ref Range   HIV Screen 4th Generation wRfx Non Reactive Non Reactive      Assessment & Plan:   Problem List Items Addressed This Visit    None    Visit Diagnoses    Encounter for CDL (commercial driving license) exam    -  Primary      DOT-  OK for 1 year  Follow up plan: Return in about 1 year (around 01/28/2019).

## 2018-04-17 ENCOUNTER — Encounter: Payer: Self-pay | Admitting: Family Medicine

## 2018-04-17 ENCOUNTER — Ambulatory Visit (INDEPENDENT_AMBULATORY_CARE_PROVIDER_SITE_OTHER): Payer: PRIVATE HEALTH INSURANCE | Admitting: Family Medicine

## 2018-04-17 VITALS — BP 136/86 | HR 66 | Wt 195.0 lb

## 2018-04-17 DIAGNOSIS — I10 Essential (primary) hypertension: Secondary | ICD-10-CM | POA: Diagnosis not present

## 2018-04-17 DIAGNOSIS — H8109 Meniere's disease, unspecified ear: Secondary | ICD-10-CM | POA: Diagnosis not present

## 2018-04-17 DIAGNOSIS — E291 Testicular hypofunction: Secondary | ICD-10-CM | POA: Diagnosis not present

## 2018-04-17 MED ORDER — SPIRONOLACTONE 25 MG PO TABS: 25.0000 mg | ORAL_TABLET | Freq: Every day | ORAL | 2 refills | Status: DC

## 2018-04-17 NOTE — Progress Notes (Signed)
BP 136/86 (BP Location: Left Arm)   Pulse 66   Wt 195 lb (88.5 kg)   SpO2 99%   BMI 28.49 kg/m    Subjective:    Patient ID: Jorge Brown, male    DOB: June 18, 1960, 58 y.o.   MRN: [id number]  HPI: Jorge Brown is a 58 y.o. male  Chief Complaint  Patient presents with  . Follow-up  . 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  Is concerned that it is bothering him not having any real edema but wants to stop and try something else that can maybe do double duty both for Mnire's and blood pressure. Reviewed not taking Spironolactone and does not recall previous Spironolactone usage. Patient also followed by urology for low testosterone takes replacement wants testosterone checked is going to urology next week. Mnire's is stable no current problems.  Relevant past medical, surgical, family and social history reviewed and updated as indicated. Interim medical history since our last visit reviewed. Allergies and medications reviewed and updated.  Review of Systems  Constitutional: Negative.   Respiratory: Negative.   Cardiovascular: Negative.     Per HPI unless specifically indicated above     Objective:    BP 136/86 (BP Location: Left Arm)   Pulse 66   Wt 195 lb (88.5 kg)   SpO2 99%   BMI 28.49 kg/m   Wt Readings from Last 3 Encounters:  04/17/18 195 lb (88.5 kg)  01/27/18 195 lb 4 oz (88.6 kg)  10/12/17 194 lb (88 kg)    Physical Exam Constitutional:      Appearance: He is well-developed.  HENT:     Head: Normocephalic and atraumatic.  Eyes:     Conjunctiva/sclera: Conjunctivae normal.  Neck:     Musculoskeletal: Normal range of motion.  Cardiovascular:     Rate and Rhythm: Normal rate and regular rhythm.     Heart sounds: Normal heart sounds.  Pulmonary:     Effort: Pulmonary effort is normal.     Breath sounds: Normal breath sounds.  Musculoskeletal: Normal range of motion.    Skin:    Findings: No erythema.  Neurological:     Mental Status: He is alert and oriented to person, place, and time.  Psychiatric:        Behavior: Behavior normal.        Thought Content: Thought content normal.        Judgment: Judgment normal.     Results for orders placed or performed in visit on 10/12/17  CBC with Differential/Platelet  Result Value Ref Range   WBC 7.7 3.4 - 10.8 x10E3/uL   RBC 5.51 4.14 - 5.80 x10E6/uL   Hemoglobin 17.9 (H) 13.0 - 17.7 g/dL   Hematocrit 53.4 (H) 37.5 - 51.0 %   MCV 97 79 - 97 fL   MCH 32.5 26.6 - 33.0 pg   MCHC 33.5 31.5 - 35.7 g/dL   RDW 13.0 12.3 - 15.4 %   Platelets 205 150 - 450 x10E3/uL   Neutrophils 62 Not Estab. %   Lymphs 27 Not Estab. %   Monocytes 10 Not Estab. %   Eos 1 Not Estab. %   Basos 0 Not Estab. %   Neutrophils Absolute 4.8 1.4 - 7.0 x10E3/uL   Lymphocytes Absolute 2.1 0.7 - 3.1 x10E3/uL   Monocytes Absolute 0.8 0.1 - 0.9 x10E3/uL   EOS (ABSOLUTE) 0.0 0.0 - 0.4 x10E3/uL  Basophils Absolute 0.0 0.0 - 0.2 x10E3/uL   Immature Granulocytes 0 Not Estab. %   Immature Grans (Abs) 0.0 0.0 - 0.1 x10E3/uL  Comprehensive metabolic panel  Result Value Ref Range   Glucose 78 65 - 99 mg/dL   BUN 16 6 - 24 mg/dL   Creatinine, Ser 0.96 0.76 - 1.27 mg/dL   GFR calc non Af Amer 89 >59 mL/min/1.73   GFR calc Af Amer 102 >59 mL/min/1.73   BUN/Creatinine Ratio 17 9 - 20   Sodium 142 134 - 144 mmol/L   Potassium 3.9 3.5 - 5.2 mmol/L   Chloride 103 96 - 106 mmol/L   CO2 25 20 - 29 mmol/L   Calcium 9.4 8.7 - 10.2 mg/dL   Total Protein 6.6 6.0 - 8.5 g/dL   Albumin 4.2 3.5 - 5.5 g/dL   Globulin, Total 2.4 1.5 - 4.5 g/dL   Albumin/Globulin Ratio 1.8 1.2 - 2.2   Bilirubin Total 1.0 0.0 - 1.2 mg/dL   Alkaline Phosphatase 63 39 - 117 IU/L   AST 22 0 - 40 IU/L   ALT 24 0 - 44 IU/L  Lipid panel  Result Value Ref Range   Cholesterol, Total 172 100 - 199 mg/dL   Triglycerides 179 (H) 0 - 149 mg/dL   HDL 47 >39 mg/dL   VLDL  Cholesterol Cal 36 5 - 40 mg/dL   LDL Calculated 89 0 - 99 mg/dL   Chol/HDL Ratio 3.7 0.0 - 5.0 ratio  PSA  Result Value Ref Range   Prostate Specific Ag, Serum 0.8 0.0 - 4.0 ng/mL  TSH  Result Value Ref Range   TSH 0.985 0.450 - 4.500 uIU/mL  Urinalysis, Routine w reflex microscopic  Result Value Ref Range   Specific Gravity, UA 1.015 1.005 - 1.030   pH, UA 7.0 5.0 - 7.5   Color, UA Yellow Yellow   Appearance Ur Clear Clear   Leukocytes, UA Negative Negative   Protein, UA Negative Negative/Trace   Glucose, UA Negative Negative   Ketones, UA Negative Negative   RBC, UA Negative Negative   Bilirubin, UA Negative Negative   Urobilinogen, Ur 0.2 0.2 - 1.0 mg/dL   Nitrite, UA Negative Negative  HIV antibody (with reflex)  Result Value Ref Range   HIV Screen 4th Generation wRfx Non Reactive Non Reactive      Assessment & Plan:   Problem List Items Addressed This Visit      Cardiovascular and Mediastinum   Hypertension - Primary    Good control but side effects to amlodipine will stop and start spironolactone 25 mg patient has physical coming up will recheck at that time special attention to potassium.      Relevant Medications   spironolactone (ALDACTONE) 25 MG tablet   Other Relevant Orders   Basic metabolic panel     Endocrine   Testicular hypofunction    Will check testosterone level      Relevant Orders   Testosterone     Nervous and Auditory   Meniere disease    Stable but will start Spironolactone          Follow up plan: Return for Physical Exam.

## 2018-04-17 NOTE — Assessment & Plan Note (Signed)
Good control but side effects to amlodipine will stop and start spironolactone 25 mg patient has physical coming up will recheck at that time special attention to potassium.

## 2018-04-17 NOTE — Assessment & Plan Note (Signed)
Will check testosterone level

## 2018-04-17 NOTE — Assessment & Plan Note (Signed)
Stable but will start Spironolactone

## 2018-04-18 ENCOUNTER — Encounter: Payer: Self-pay | Admitting: Family Medicine

## 2018-04-18 LAB — BASIC METABOLIC PANEL
BUN / CREAT RATIO: 22 — AB (ref 9–20)
BUN: 20 mg/dL (ref 6–24)
CHLORIDE: 102 mmol/L (ref 96–106)
CO2: 18 mmol/L — ABNORMAL LOW (ref 20–29)
CREATININE: 0.9 mg/dL (ref 0.76–1.27)
Calcium: 9.6 mg/dL (ref 8.7–10.2)
GFR calc non Af Amer: 96 mL/min/{1.73_m2} (ref >59)
GFR, EST AFRICAN AMERICAN: 111 mL/min/{1.73_m2} (ref >59)
GLUCOSE: 83 mg/dL (ref 65–99)
Potassium: 4.1 mmol/L (ref 3.5–5.2)
Sodium: 141 mmol/L (ref 134–144)

## 2018-04-18 LAB — TESTOSTERONE: TESTOSTERONE: 451 ng/dL (ref 264–916)

## 2018-05-16 ENCOUNTER — Telehealth: Payer: PRIVATE HEALTH INSURANCE | Admitting: Physician Assistant

## 2018-05-16 DIAGNOSIS — J208 Acute bronchitis due to other specified organisms: Secondary | ICD-10-CM | POA: Diagnosis not present

## 2018-05-16 DIAGNOSIS — B9689 Other specified bacterial agents as the cause of diseases classified elsewhere: Secondary | ICD-10-CM | POA: Diagnosis not present

## 2018-05-16 MED ORDER — AZITHROMYCIN 250 MG PO TABS: ORAL_TABLET | ORAL | 0 refills | Status: AC

## 2018-05-16 NOTE — Progress Notes (Signed)
We are sorry that you are not feeling well.  Here is how we plan to help!  Based on your presentation I believe you most likely have A cough due to bacteria.  When patients have a fever and a productive cough with a change in color or increased sputum production, we are concerned about bacterial bronchitis.  If left untreated it can progress to pneumonia.  If your symptoms do not improve with your treatment plan it is important that you contact your provider.   I have prescribed Azithromyin 250 mg: two tablets now and then one tablet daily for 4 additonal days     From your responses in the eVisit questionnaire you describe inflammation in the upper respiratory tract which is causing a significant cough.  This is commonly called Bronchitis and has four common causes:   Allergies Viral Infections Acid Reflux Bacterial Infection Allergies, viruses and acid reflux are treated by controlling symptoms or eliminating the cause. An example might be a cough caused by taking certain blood pressure medications. You stop the cough by changing the medication. Another example might be a cough caused by acid reflux. Controlling the reflux helps control the cough.  USE OF BRONCHODILATOR ("RESCUE") INHALERS: There is a risk from using your bronchodilator too frequently.  The risk is that over-reliance on a medication which only relaxes the muscles surrounding the breathing tubes can reduce the effectiveness of medications prescribed to reduce swelling and congestion of the tubes themselves.  Although you feel brief relief from the bronchodilator inhaler, your asthma may actually be worsening with the tubes becoming more swollen and filled with mucus.  This can delay other crucial treatments, such as oral steroid medications. If you need to use a bronchodilator inhaler daily, several times per day, you should discuss this with your provider.  There are probably better treatments that could be used to keep your asthma  under control.     HOME CARE Only take medications as instructed by your medical team. Complete the entire course of an antibiotic. Drink plenty of fluids and get plenty of rest. Avoid close contacts especially the very young and the elderly Cover your mouth if you cough or cough into your sleeve. Always remember to wash your hands A steam or ultrasonic humidifier can help congestion.   GET HELP RIGHT AWAY IF: You develop worsening fever. You become short of breath You cough up blood. Your symptoms persist after you have completed your treatment plan MAKE SURE YOU  Understand these instructions. Will watch your condition. Will get help right away if you are not doing well or get worse.  Your e-visit answers were reviewed by a board certified advanced clinical practitioner to complete your personal care plan.  Depending on the condition, your plan could have included both over the counter or prescription medications. If there is a problem please reply once you have received a response from your provider. Your safety is important to [place].  If you have drug allergies check your prescription carefully.    You can use MyChart to ask questions about today's visit, request a non-urgent call back, or ask for a work or school excuse for 24 hours related to this e-Visit. If it has been greater than 24 hours you will need to follow up with your provider, or enter a new e-Visit to address those concerns. You will get an e-mail in the next two days asking about your experience.  I hope that your e-visit has been valuable and  will speed your recovery. Thank you for using e-visits.   ===View-only below this line===   ----- Message -----    From: [name]    Sent: 05/16/2018 11:05 AM EST      To: E-Visit Mailing List Subject: E-Visit Submission: Cough  E-Visit Submission: Cough --------------------------------  Question: How long have you been coughing? Answer:   7 or more  days  Question: How would you describe the cough? Answer:   A deep cough  Question: How often are you coughing? Answer:   Constantly  Question: Does the cough prevent you from sleeping at night? Answer:   Yes  Question: What other symptoms have you experienced with the cough? Answer:   Runny nose  Question: Do you have a fever? Answer:   No, I do not have a fever  Question: Are you coughing up any mucus? Answer:   I am coughing up a lot of mucus  Question: Do you use a maintenance inhaler? Answer:   No  Question: Do you use a rescue inhaler (such as Ventolin?) Answer:   No  Question: Have you previously required a prescription for prednisone for cough? Answer:   No  Question: Are you diabetic? Answer:   No  Question: What is the appearance of the mucus? Answer:   The mucus is thick  Question: Do you have any of the following? Answer:   Fatigue  Question: Do you smoke? Answer:   No  Question: Have you ever smoked? Answer:   I have never smoked  Question: Are there people you know with similar symptoms? Answer:   Yes  Question: Are you experiencing any of the following? Answer:   None of  the above  Question: Are you having difficulty breathing? Answer:   No  Question: Is your coughing worse when you are exposed to pollen, dust, or other things in the environment? Answer:   I dont know  Question: Have you been treated for a similar cough in the past? Answer:   Yes  Question: What treatments have worked in the past?  Answer:   antibiotics are what they gave me last time I think  Question: What treatment(s) in the past have been unsuccessful? Answer:     Question: Have you ever been diagnosed with asthma, bronchitis, or lung disease? Answer:   No  Question: Have you recently started on any medications for your heart or for blood pressure? Answer:   Yes  Question: Have you recently been hospitalized? Answer:   No  Question: Please list your  medication allergies that you may have ? (If 'none' , please list as 'none') Answer:   none  Question: Please list any additional comments  Answer:   I have been on medicine for blood pressure.  I didn't just start it.              I have had this terrible cough since Jan when I had almost flu-like symptoms.  Those  symptoms are gone but the cough is getting worse and deeper in my chest.  I am coughing pretty much all day and it is a very congested cough.  Some coughing spells are very long.  A total of 5-10 minutes was spent evaluating this patients questionnaire and formulating a plan of care.

## 2018-06-22 ENCOUNTER — Telehealth: Payer: Self-pay | Admitting: Family Medicine

## 2018-06-22 MED ORDER — PANTOPRAZOLE SODIUM 40 MG PO TBEC: 40.0000 mg | DELAYED_RELEASE_TABLET | Freq: Every day | ORAL | 3 refills | Status: DC

## 2018-06-22 NOTE — Telephone Encounter (Signed)
Copied from [place] [id number]. Topic: General - Other >> Jun 22, 2018  9:29 AM Oneta Rack wrote: Relation to pt: self  Call back number:  [phone] (Preferred)  Reason for call:  Patient states prilosec is not working for acid reflux and would like PCP to send in alternate, please advise

## 2018-06-22 NOTE — Telephone Encounter (Signed)
Copied from [place] [id number]. Topic: General - Other >> Jun 22, 2018  9:29 AM Oneta Rack wrote: Relation to pt: self  Call back number:  [phone] (Preferred)  Reason for call:  Patient states prilosec is not working for acid reflux and would like PCP to send in alternate, please advise

## 2018-07-17 ENCOUNTER — Ambulatory Visit (INDEPENDENT_AMBULATORY_CARE_PROVIDER_SITE_OTHER): Payer: No Typology Code available for payment source | Admitting: Family Medicine

## 2018-07-17 ENCOUNTER — Encounter: Payer: Self-pay | Admitting: Family Medicine

## 2018-07-17 DIAGNOSIS — K219 Gastro-esophageal reflux disease without esophagitis: Secondary | ICD-10-CM

## 2018-07-17 DIAGNOSIS — I10 Essential (primary) hypertension: Secondary | ICD-10-CM

## 2018-07-17 MED ORDER — SPIRONOLACTONE 25 MG PO TABS: 25.0000 mg | ORAL_TABLET | Freq: Every day | ORAL | 2 refills | Status: AC

## 2018-07-17 MED ORDER — PANTOPRAZOLE SODIUM 40 MG PO TBEC: 40.0000 mg | DELAYED_RELEASE_TABLET | Freq: Every day | ORAL | 2 refills | Status: AC

## 2018-07-17 NOTE — Assessment & Plan Note (Signed)
The current medical regimen is effective;  continue present plan and medications.  

## 2018-07-17 NOTE — Progress Notes (Signed)
BP (!) 144/78    Subjective:    Patient ID: Jorge Brown, male    DOB: 12/18/1960, 58 y.o.   MRN: [id number]  HPI: Jorge Brown is a 58 y.o. male  Med check Discussed blood pressure and fluid pills doing well no complaints good blood pressure control. Takes reflux meds without problems. Following COVID-19 restrictions without problems. 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  Telemedicine using audio/video telecommunications for a synchronous communication visit. Today's visit due to COVID-19 isolation precautions I connected with and verified that I am speaking with the correct person using two identifiers.   I discussed the limitations, risks, security and privacy concerns of performing an evaluation and management service by telecommunication and the availability of in person appointments. I also discussed with the patient that there may be a patient responsible charge related to this service. The patient expressed understanding and agreed to proceed. The patient's location is work. I am at home. Relevant past medical, surgical, family and social history reviewed and updated as indicated. Interim medical history since our last visit reviewed. Allergies and medications reviewed and updated.  Review of Systems  Constitutional: Negative.   Respiratory: Negative.   Cardiovascular: Negative.     Per HPI unless specifically indicated above     Objective:    BP (!) 144/78   Wt Readings from Last 3 Encounters:  04/17/18 195 lb (88.5 kg)  01/27/18 195 lb 4 oz (88.6 kg)  10/12/17 194 lb (88 kg)    Physical Exam  Results for orders placed or performed in visit on 40/34/74  Basic metabolic panel  Result Value Ref Range   Glucose 83 65 - 99 mg/dL   BUN 20 6 - 24 mg/dL   Creatinine, Ser 0.90 0.76 - 1.27 mg/dL   GFR calc non Af Amer 96 >59 mL/min/1.73   GFR calc Af Amer 111 >59 mL/min/1.73   BUN/Creatinine Ratio 22 (H) 9  - 20   Sodium 141 134 - 144 mmol/L   Potassium 4.1 3.5 - 5.2 mmol/L   Chloride 102 96 - 106 mmol/L   CO2 18 (L) 20 - 29 mmol/L   Calcium 9.6 8.7 - 10.2 mg/dL  Testosterone  Result Value Ref Range   Testosterone 451 264 - 916 ng/dL      Assessment & Plan:   Problem List Items Addressed This Visit      Cardiovascular and Mediastinum   Hypertension    The current medical regimen is effective;  continue present plan and medications.       Relevant Medications   spironolactone (ALDACTONE) 25 MG tablet     Digestive   GERD (gastroesophageal reflux disease)    The current medical regimen is effective;  continue present plan and medications.       Relevant Medications   pantoprazole (PROTONIX) 40 MG tablet    Discussed COVID-19 restrictions and precautions I discussed the assessment and treatment plan with the patient. The patient was provided an opportunity to ask questions and all were answered. The patient agreed with the plan and demonstrated an understanding of the instructions.   The patient was advised to call back or seek an in-person evaluation if the symptoms worsen or if the condition fails to improve as anticipated.   I provided 21+ minutes of time during this encounter. Follow up plan: Return in about 3 months (around 10/16/2018) for Physical Exam.

## 2018-09-07 ENCOUNTER — Telehealth: Payer: Self-pay | Admitting: Family Medicine

## 2018-09-07 ENCOUNTER — Other Ambulatory Visit: Payer: Self-pay | Admitting: Family Medicine

## 2018-09-07 MED ORDER — METOPROLOL SUCCINATE ER 50 MG PO TB24: 50.0000 mg | ORAL_TABLET | Freq: Every day | ORAL | 3 refills | Status: DC

## 2018-09-07 NOTE — Telephone Encounter (Signed)
Copied from [place] [id number]. Topic: Quick Communication - Rx Refill/Question >> Sep 07, 2018  8:42 AM Margot Ables wrote: Medication: benazepril (LOTENSIN) 20 MG tablet - pt has been coughing for months and pt has seen [facility] and was advised chronic cough is a side effect of this medication. They said they do not believe pt has acid reflux. They advised pt he may not need pantoprazole and advised him to contact PCP to change benazepril. Please advise.  Has the patient contacted their pharmacy? no Preferred Pharmacy (with phone number or street name): [id number] [address], [place] [phone] (Phone) [phone] (Fax)

## 2018-09-07 NOTE — Telephone Encounter (Signed)
Message relayed to patient. Verbalized understanding and denied questions. PT scheuled for telephone visit w/ Dr. Jeananne Rama in ~ 4 weeks.

## 2018-09-07 NOTE — Telephone Encounter (Signed)
Call pt d/c benazepril and rx called in metoprolol Follow BP and cough

## 2018-09-18 ENCOUNTER — Other Ambulatory Visit: Payer: Self-pay

## 2018-09-18 ENCOUNTER — Ambulatory Visit
Admission: RE | Admit: 2018-09-18 | Discharge: 2018-09-18 | Disposition: A | Discharge status: Home / Self Care | Payer: PRIVATE HEALTH INSURANCE | Source: Ambulatory Visit | Attending: Unknown Physician Specialty | Admitting: Unknown Physician Specialty

## 2018-09-18 ENCOUNTER — Other Ambulatory Visit: Payer: Self-pay | Admitting: Unknown Physician Specialty

## 2018-09-18 DIAGNOSIS — R05 Cough: Secondary | ICD-10-CM | POA: Insufficient documentation

## 2018-09-18 DIAGNOSIS — R059 Cough, unspecified: Secondary | ICD-10-CM

## 2018-10-16 ENCOUNTER — Encounter: Payer: Self-pay | Admitting: Family Medicine

## 2018-10-16 ENCOUNTER — Ambulatory Visit (INDEPENDENT_AMBULATORY_CARE_PROVIDER_SITE_OTHER): Payer: No Typology Code available for payment source | Admitting: Family Medicine

## 2018-10-16 ENCOUNTER — Other Ambulatory Visit: Payer: Self-pay

## 2018-10-16 ENCOUNTER — Encounter: Payer: PRIVATE HEALTH INSURANCE | Admitting: Family Medicine

## 2018-10-16 ENCOUNTER — Ambulatory Visit: Payer: Self-pay | Admitting: Family Medicine

## 2018-10-16 DIAGNOSIS — R05 Cough: Secondary | ICD-10-CM | POA: Diagnosis not present

## 2018-10-16 DIAGNOSIS — I10 Essential (primary) hypertension: Secondary | ICD-10-CM

## 2018-10-16 DIAGNOSIS — T464X5A Adverse effect of angiotensin-converting-enzyme inhibitors, initial encounter: Secondary | ICD-10-CM

## 2018-10-16 DIAGNOSIS — K219 Gastro-esophageal reflux disease without esophagitis: Secondary | ICD-10-CM | POA: Diagnosis not present

## 2018-10-16 NOTE — Assessment & Plan Note (Signed)
Blood pressure reports doing well with no complaints taking medication without problems

## 2018-10-16 NOTE — Progress Notes (Signed)
   BP 135/87    Subjective:    Patient ID: Jorge Brown, male    DOB: 10-14-1960, 58 y.o.   MRN: [id number]  HPI: Jorge Brown is a 58 y.o. male  Med check    Relevant past medical, surgical, family and social history reviewed and updated as indicated. Interim medical history since our last visit reviewed. Allergies and medications reviewed and updated.  Review of Systems  Constitutional: Negative.   Respiratory: Negative.   Cardiovascular: Negative.     Per HPI unless specifically indicated above     Objective:    BP 135/87   Wt Readings from Last 3 Encounters:  04/17/18 195 lb (88.5 kg)  01/27/18 195 lb 4 oz (88.6 kg)  10/12/17 194 lb (88 kg)    Physical Exam  Results for orders placed or performed in visit on 10/93/23  Basic metabolic panel  Result Value Ref Range   Glucose 83 65 - 99 mg/dL   BUN 20 6 - 24 mg/dL   Creatinine, Ser 0.90 0.76 - 1.27 mg/dL   GFR calc non Af Amer 96 >59 mL/min/1.73   GFR calc Af Amer 111 >59 mL/min/1.73   BUN/Creatinine Ratio 22 (H) 9 - 20   Sodium 141 134 - 144 mmol/L   Potassium 4.1 3.5 - 5.2 mmol/L   Chloride 102 96 - 106 mmol/L   CO2 18 (L) 20 - 29 mmol/L   Calcium 9.6 8.7 - 10.2 mg/dL  Testosterone  Result Value Ref Range   Testosterone 451 264 - 916 ng/dL      Assessment & Plan:   Problem List Items Addressed This Visit      Cardiovascular and Mediastinum   Hypertension    Blood pressure reports doing well with no complaints taking medication without problems        Digestive   GERD (gastroesophageal reflux disease)    The current medical regimen is effective;  continue present plan and medications.         Other   Cough due to ACE inhibitor    Patient's cough symptoms are resolving still present especially in the morning and with drinking coffee but otherwise are significantly less after stopping benazepril.         Telemedicine using audio/video telecommunications for a synchronous communication  visit. Today's visit due to COVID-19 isolation precautions I connected with and verified that I am speaking with the correct person using two identifiers.   I discussed the limitations, risks, security and privacy concerns of performing an evaluation and management service by telecommunication and the availability of in person appointments. I also discussed with the patient that there may be a patient responsible charge related to this service. The patient expressed understanding and agreed to proceed. The patient's location is I am at home.   I discussed the assessment and treatment plan with the patient. The patient was provided an opportunity to ask questions and all were answered. The patient agreed with the plan and demonstrated an understanding of the instructions.   The patient was advised to call back or seek an in-person evaluation if the symptoms worsen or if the condition fails to improve as anticipated.   I provided 21+ minutes of time during this encounter.  Follow up plan: Return in about 4 weeks (around 11/13/2018) for Physical Exam.

## 2018-10-16 NOTE — Assessment & Plan Note (Signed)
Patient's cough symptoms are resolving still present especially in the morning and with drinking coffee but otherwise are significantly less after stopping benazepril.

## 2018-10-16 NOTE — Assessment & Plan Note (Signed)
The current medical regimen is effective;  continue present plan and medications.  

## 2018-10-19 ENCOUNTER — Other Ambulatory Visit: Payer: Self-pay | Admitting: Unknown Physician Specialty

## 2018-10-19 DIAGNOSIS — R05 Cough: Secondary | ICD-10-CM

## 2018-10-19 DIAGNOSIS — R1312 Dysphagia, oropharyngeal phase: Secondary | ICD-10-CM

## 2018-10-19 DIAGNOSIS — R059 Cough, unspecified: Secondary | ICD-10-CM

## 2018-11-30 ENCOUNTER — Other Ambulatory Visit: Payer: Self-pay

## 2018-11-30 ENCOUNTER — Ambulatory Visit
Admission: RE | Admit: 2018-11-30 | Discharge: 2018-11-30 | Disposition: A | Discharge status: Home / Self Care | Payer: PRIVATE HEALTH INSURANCE | Source: Ambulatory Visit | Attending: Unknown Physician Specialty | Admitting: Unknown Physician Specialty

## 2018-11-30 DIAGNOSIS — R1312 Dysphagia, oropharyngeal phase: Secondary | ICD-10-CM

## 2018-11-30 DIAGNOSIS — R059 Cough, unspecified: Secondary | ICD-10-CM

## 2018-11-30 DIAGNOSIS — R05 Cough: Secondary | ICD-10-CM | POA: Insufficient documentation

## 2018-11-30 NOTE — Therapy (Signed)
[place], [place], [address] Phone: [phone]   Fax:     Modified Barium Swallow  Patient Details  Name: Jorge Brown MRN: [id number] Date of Birth: 04/11/1960 No data recorded  Encounter Date: 11/30/2018  End of Session - 11/30/18 1338    Visit Number  1    Number of Visits  1    Date for SLP Re-Evaluation  11/30/18    SLP Start Time  29    SLP Stop Time   1315    SLP Time Calculation (min)  45 min    Activity Tolerance  Patient tolerated treatment well       Past Medical History:  Diagnosis Date  . Cancer ([place])    skin  . GERD (gastroesophageal reflux disease)   . Hypertension   . Meniere disease   . Testicular hypofunction     Past Surgical History:  Procedure Laterality Date  . COLONOSCOPY WITH PROPOFOL N/A 07/23/2016   Procedure: COLONOSCOPY WITH PROPOFOL;  Surgeon: Lucilla Lame, MD;  Location: [facility];  Service: Endoscopy;  Laterality: N/A;  . ESOPHAGOGASTRODUODENOSCOPY (EGD) WITH PROPOFOL N/A 07/23/2016   Procedure: ESOPHAGOGASTRODUODENOSCOPY (EGD) WITH PROPOFOL;  Surgeon: Lucilla Lame, MD;  Location: [facility];  Service: Endoscopy;  Laterality: N/A;  . implanted testosterone pellets  2012  . MENISCUS REPAIR Right 2014  . POLYPECTOMY  07/23/2016   Procedure: POLYPECTOMY;  Surgeon: Lucilla Lame, MD;  Location: [facility];  Service: Endoscopy;;    There were no vitals filed for this visit.   Subjective: Patient behavior: (alertness, ability to follow instructions, etc.): The patient is alert and able to fully participate in the study  Chief complaint: Patient reports chronic dry cough and that he has increased coughing in the morning when drinking his coffee   Objective:  Radiological Procedure: A videoflouroscopic evaluation of oral-preparatory, reflex initiation, and pharyngeal phases of the swallow was performed; as well as a screening of the  upper esophageal phase.  I. POSTURE: Upright in MBS chair and standing  II. VIEW: Lateral and A-P  III. COMPENSATORY STRATEGIES: N/A  IV. BOLUSES ADMINISTERED:   Thin Liquid: 1 small, 3 rapid consecutive   Nectar-thick Liquid: 2 moderate   Honey-thick Liquid: DNT   Puree: 3 teaspoon presentations   Mechanical Soft: 1/4 graham cracker in applesauce  V. RESULTS OF EVALUATION: A. ORAL PREPARATORY PHASE: (The lips, tongue, and velum are observed for strength and coordination)       **Overall Severity Rating: within normal limits   B. SWALLOW INITIATION/REFLEX: (The reflex is normal if "triggered" by the time the bolus reached the base of the tongue)  **Overall Severity Rating: Mild, triggers while falling from the valleculae to the pyriform sinuses with liquids  C. PHARYNGEAL PHASE: (Pharyngeal function is normal if the bolus shows rapid, smooth, and continuous transit through the pharynx and there is no pharyngeal residue after the swallow)  **Overall Severity Rating: within normal limits   D. LARYNGEAL PENETRATION: (Material entering into the laryngeal inlet/vestibule but not aspirated) None  E. ASPIRATION: None  F. ESOPHAGEAL PHASE: (Screening of the upper esophagus) An esophageal sweep in the upright position with liquid and solid consistencies showed mid-esophageal retention with retrograde flow below the level of the pharyngoesophageal segment.     ASSESSMENT: This 58 year old man; with concern for aspiration secondary chronic cough; is presenting with minimal oropharyngeal dysphagia characterized by delayed pharyngeal swallow initiation.  Oral control of the  bolus including oral hold, rotary mastication, and anterior to posterior transfer is within functional limits.   Aspects of the pharyngeal stage of swallowing including tongue base retraction, hyolaryngeal excursion, epiglottic inversion, and duration/amplitude of UES opening are within normal limits.  There is no  observed pharyngeal residue, laryngeal penetration, or tracheal aspiration.  The patient is not at significant risk for prandial aspiration and aspiration does not appear the be the source of his chronic cough.  An esophageal sweep in the upright position with liquid and solid consistencies showed mid-esophageal retention with retrograde flow below the level of the pharyngoesophageal segment.     PLAN/RECOMMENDATIONS:  A. Diet: Regular   B. Swallowing Precautions: No special precautions indicated   C. Recommended consultation to: follow up with MDs as recommended   D. Therapy recommendations: speech therapy is not indicated   E. Results and recommendations were discussed with the patient immediately following the study and the final report routed to the referring MD.  Oropharyngeal dysphagia - Plan: DG SWALLOW FUNC SPEECH PATH, DG SWALLOW FUNC SPEECH PATH  Cough - Plan: DG SWALLOW [place], [place]        Problem List Patient Active Problem List   Diagnosis Date Noted  . Cough due to ACE inhibitor 10/16/2018  . Meniere disease 04/17/2018  . Benign neoplasm of cecum   . Polyp of sigmoid colon   . GERD (gastroesophageal reflux disease) 03/08/2016  . 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 Hypertension    [name], MS/CCC- SLP  Lou Miner 11/30/2018, 1:39 PM  [facility] [place], [place], [address] Phone: [phone]   Fax:     Name: Jorge Brown MRN: [id number] Date of Birth: 29-Nov-1960

## 2018-12-07 ENCOUNTER — Other Ambulatory Visit: Payer: Self-pay | Admitting: Family Medicine

## 2018-12-07 NOTE — Telephone Encounter (Signed)
Requested medication (s) are due for refill today: no  Requested medication (s) are on the active medication list: no  Last refill: 06/10/2018  Future visit scheduled: no  Notes to clinic:  The original prescription was discontinued on 09/07/2018 by Guadalupe Maple, MD. Renewing this prescription may not be appropriate   Requested Prescriptions  Pending Prescriptions Disp Refills   benazepril (LOTENSIN) 20 MG tablet [Pharmacy Med Name: BENAZEPRIL 20MG  TABLETS] 90 tablet 4    Sig: TAKE 1 TABLET(20 MG) BY MOUTH DAILY     Cardiovascular:  ACE Inhibitors Failed - 12/07/2018  7:02 AM      Failed - Cr in normal range and within 180 days    Creatinine, Ser  Date Value Ref Range Status  04/17/2018 0.90 0.76 - 1.27 mg/dL Final         Failed - K in normal range and within 180 days    Potassium  Date Value Ref Range Status  04/17/2018 4.1 3.5 - 5.2 mmol/L Final         Passed - Patient is not pregnant      Passed - Last BP in normal range    BP Readings from Last 1 Encounters:  10/16/18 135/87         Passed - Valid encounter within last 6 months    Recent Outpatient Visits          1 month ago Essential hypertension   [facility] Crissman, Jeannette How, MD   4 months ago Essential hypertension   [place], Jeannette How, MD   7 months ago Essential hypertension   [facility] [place] Crissman, Jeannette How, MD   10 months ago 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 Kathrine Haddock, NP   1 year ago Essential hypertension   [facility] Crissman, Jeannette How, MD

## 2018-12-21 ENCOUNTER — Ambulatory Visit: Payer: No Typology Code available for payment source | Admitting: Unknown Physician Specialty

## 2019-01-03 ENCOUNTER — Other Ambulatory Visit: Payer: Self-pay | Admitting: Family Medicine

## 2019-01-11 ENCOUNTER — Ambulatory Visit: Payer: No Typology Code available for payment source | Admitting: Unknown Physician Specialty

## 2019-01-18 ENCOUNTER — Other Ambulatory Visit: Payer: Self-pay

## 2019-01-18 ENCOUNTER — Encounter: Payer: Self-pay | Admitting: Unknown Physician Specialty

## 2019-01-18 ENCOUNTER — Ambulatory Visit (INDEPENDENT_AMBULATORY_CARE_PROVIDER_SITE_OTHER): Payer: Self-pay | Admitting: Unknown Physician Specialty

## 2019-01-18 VITALS — BP 126/79 | HR 58 | Ht 69.0 in | Wt 192.0 lb

## 2019-01-18 DIAGNOSIS — Z024 Encounter for examination for driving license: Secondary | ICD-10-CM

## 2019-01-18 NOTE — Progress Notes (Signed)
   BP 126/79   Pulse (!) 58   Ht 5\' 9"  (1.753 m)   Wt 192 lb (87.1 kg)   BMI 28.35 kg/m    Subjective:    Patient ID: Jorge Brown, male    DOB: [birth year], 58 y.o.   MRN: [id number]  HPI: Jorge Brown is a 58 y.o. male  Chief Complaint  Patient presents with  . DOT   Pt is here for a DOT physical.  History is significant for Hypertension treated with Metoprolol.  Also with Meniere's and knows it's onset and avoids driving  See form    Relevant past medical, surgical, family and social history reviewed and updated as indicated. Interim medical history since our last visit reviewed. Allergies and medications reviewed and updated.  Review of Systems  Per HPI unless specifically indicated above     Objective:    BP 126/79   Pulse (!) 58   Ht 5\' 9"  (1.753 m)   Wt 192 lb (87.1 kg)   BMI 28.35 kg/m   Wt Readings from Last 3 Encounters:  01/18/19 192 lb (87.1 kg)  04/17/18 195 lb (88.5 kg)  01/27/18 195 lb 4 oz (88.6 kg)    Physical Exam  See form  Results for orders placed or performed in visit on [id number]  Basic metabolic panel  Result Value Ref Range   Glucose 83 65 - 99 mg/dL   BUN 20 6 - 24 mg/dL   Creatinine, Ser 0.90 0.76 - 1.27 mg/dL   GFR calc non Af Amer 96 >59 mL/min/1.73   GFR calc Af Amer 111 >59 mL/min/1.73   BUN/Creatinine Ratio 22 (H) 9 - 20   Sodium 141 134 - 144 mmol/L   Potassium 4.1 3.5 - 5.2 mmol/L   Chloride 102 96 - 106 mmol/L   CO2 18 (L) 20 - 29 mmol/L   Calcium 9.6 8.7 - 10.2 mg/dL  Testosterone  Result Value Ref Range   Testosterone 451 264 - 916 ng/dL      Assessment & Plan:   Problem List Items Addressed This Visit    None    Visit Diagnoses    Encounter for commercial driver medical examination (CDME)    -  Primary      See form   Follow up plan: F/U in one year due to hypertension

## 2019-05-10 ENCOUNTER — Other Ambulatory Visit: Payer: Self-pay | Admitting: Nurse Practitioner

## 2019-05-10 ENCOUNTER — Other Ambulatory Visit: Payer: Self-pay

## 2019-05-10 MED ORDER — METOPROLOL SUCCINATE ER 50 MG PO TB24: ORAL_TABLET | ORAL | 3 refills | Status: DC

## 2019-05-10 NOTE — Telephone Encounter (Signed)
Reordered, but does need office visit scheduled for April when next refills due.  Thank you.

## 2019-05-10 NOTE — Telephone Encounter (Signed)
Refill request for Metoprolol LOV: 01/18/2019

## 2019-05-15 NOTE — Telephone Encounter (Signed)
Appointment scheduled.

## 2019-05-21 ENCOUNTER — Ambulatory Visit (INDEPENDENT_AMBULATORY_CARE_PROVIDER_SITE_OTHER): Payer: No Typology Code available for payment source | Admitting: Nurse Practitioner

## 2019-05-21 ENCOUNTER — Other Ambulatory Visit: Payer: Self-pay

## 2019-05-21 ENCOUNTER — Encounter: Payer: Self-pay | Admitting: Nurse Practitioner

## 2019-05-21 VITALS — BP 148/91 | HR 63 | Temp 98.1°F | Ht 70.0 in | Wt 190.0 lb

## 2019-05-21 DIAGNOSIS — I10 Essential (primary) hypertension: Secondary | ICD-10-CM | POA: Diagnosis not present

## 2019-05-21 MED ORDER — LOSARTAN POTASSIUM 25 MG PO TABS: 25.0000 mg | ORAL_TABLET | Freq: Every day | ORAL | 0 refills | Status: DC

## 2019-05-21 NOTE — Assessment & Plan Note (Addendum)
Chronic, uncontrolled.  Will stop metoprolol today and start losartan 25mg  daily.  Amlodipine and benazepril tried previously in past but stopped due to side effects.  Advised to stop medication right away and call office with increase in muscle spasms or heart palpitations.  BMP checked about 4 weeks ago at Urology office, will repeat BMP in 2 weeks at BP f/u appointment.  Patient advised to monitor BP daily at home in the meantime and call office with any concerns.

## 2019-05-21 NOTE — Progress Notes (Signed)
BP (!) 148/91 (BP Location: Left Arm, Patient Position: Sitting, Cuff Size: Normal)   Pulse 63   Temp 98.1 F (36.7 C) (Oral)   Ht 5\' 10"  (1.778 m)   Wt 190 lb (86.2 kg)   SpO2 98%   BMI 27.26 kg/m    Subjective:    Patient ID: Jorge Brown, male    DOB: [birth year], 59 y.o.   MRN: [id number]  HPI: Jorge Brown is a 59 y.o. male  Chief Complaint  Patient presents with  . Hypertension   HYPERTENSION Hypertension status: controlled  Satisfied with current treatment? yes Duration of hypertension: chronic BP monitoring frequency:  rarely BP range: ~135/79  BP medication side effects:  no Medication compliance: excellent compliance Previous BP meds:spironalactone, metoprolol, benazepril (had a cough) Aspirin: no Recurrent headaches: no Visual changes: no Palpitations: no Dyspnea: no Chest pain: no Lower extremity edema: no Dizzy/lightheaded: no   Reports since he started the Metoprolol, he notices he gets much more short of breath with physical activity.  Would like to try another BP medication.  No Known Allergies  Outpatient Encounter Medications as of 05/21/2019  Medication Sig  . metoprolol succinate (TOPROL-XL) 50 MG 24 hr tablet TAKE 1 TABLET BY MOUTH DAILY, TAKE WITH OR IMMEDIATELY FOLLOWING A MEAL  . pantoprazole (PROTONIX) 40 MG tablet Take 1 tablet (40 mg total) by mouth daily.  Marland Kitchen spironolactone (ALDACTONE) 25 MG tablet Take 1 tablet (25 mg total) by mouth daily.  Marland Kitchen losartan (COZAAR) 25 MG tablet Take 1 tablet (25 mg total) by mouth daily.   No facility-administered encounter medications on file as of 05/21/2019.   Patient Active Problem List   Diagnosis Date Noted  . Cough due to ACE inhibitor 10/16/2018  . Meniere disease 04/17/2018  . Benign neoplasm of cecum   . Polyp of sigmoid colon   . GERD (gastroesophageal reflux disease) 03/08/2016  . 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 Hypertension    Past Medical History:  Diagnosis Date  . Cancer  ([place])    skin  . GERD (gastroesophageal reflux disease)   . Hypertension   . Meniere disease   . Testicular hypofunction    Review of Systems  Constitutional: Negative.  Negative for activity change, fatigue and fever.  Eyes: Negative.  Negative for visual disturbance.  Respiratory: Positive for cough (in morning, stable per patient). Negative for shortness of breath and wheezing.   Cardiovascular: Negative.  Negative for chest pain, palpitations and leg swelling.  Skin: Negative.  Negative for rash.  Neurological: Negative.  Negative for dizziness, light-headedness, numbness and headaches.  Psychiatric/Behavioral: Negative.  Negative for agitation, confusion and sleep disturbance. The patient is not nervous/anxious.    Per HPI unless specifically indicated above    Objective:    BP (!) 148/91 (BP Location: Left Arm, Patient Position: Sitting, Cuff Size: Normal)   Pulse 63   Temp 98.1 F (36.7 C) (Oral)   Ht 5\' 10"  (1.778 m)   Wt 190 lb (86.2 kg)   SpO2 98%   BMI 27.26 kg/m   Wt Readings from Last 3 Encounters:  05/21/19 190 lb (86.2 kg)  01/18/19 192 lb (87.1 kg)  04/17/18 195 lb (88.5 kg)    Physical Exam Vitals and nursing note reviewed.  Constitutional:      General: He is not in acute distress.    Appearance: Normal appearance. He is normal weight. He is not toxic-appearing.  HENT:     Head: Normocephalic.  Eyes:  General: No scleral icterus.       Right eye: No discharge.        Left eye: No discharge.     Extraocular Movements: Extraocular movements intact.     Pupils: Pupils are equal, round, and reactive to light.  Cardiovascular:     Rate and Rhythm: Normal rate and regular rhythm.     Pulses: Normal pulses.     Heart sounds: Normal heart sounds. No murmur.  Pulmonary:     Effort: Pulmonary effort is normal. No respiratory distress.     Breath sounds: Normal breath sounds. No wheezing or rhonchi.  Skin:    General: Skin is warm and dry.      Coloration: Skin is not jaundiced.     Findings: No bruising.  Neurological:     General: No focal deficit present.     Mental Status: He is alert and oriented to person, place, and time.     Motor: No weakness.     Coordination: Coordination normal.     Gait: Gait normal.  Psychiatric:        Mood and Affect: Mood normal.        Behavior: Behavior normal.        Thought Content: Thought content normal.        Judgment: Judgment normal.        Assessment & Plan:   Problem List Items Addressed This Visit      Cardiovascular and Mediastinum   Hypertension - Primary    Chronic, uncontrolled.  Will stop metoprolol today and start losartan 25mg  daily.  Amlodipine and benazepril tried previously in past but stopped due to side effects.  Advised to stop medication right away and call office with increase in muscle spasms or heart palpitations.  BMP checked about 4 weeks ago at Urology office, will repeat BMP in 2 weeks at BP f/u appointment.  Patient advised to monitor BP daily at home in the meantime and call office with any concerns.       Relevant Medications   losartan (COZAAR) 25 MG tablet       Follow up plan:  Return in about 2 weeks (around 06/04/2019) for BP f/u.

## 2019-06-04 ENCOUNTER — Ambulatory Visit: Payer: No Typology Code available for payment source | Admitting: Nurse Practitioner

## 2019-06-04 NOTE — Progress Notes (Deleted)
There were no vitals taken for this visit.   Subjective:    Patient ID: Jorge Brown, male    DOB: 11-14-1960, 59 y.o.   MRN: [id number]  HPI: Jorge Brown is a 59 y.o. male  No chief complaint on file.  HYPERTENSION Hypertension status: {Blank single:19197::"controlled","uncontrolled","better","worse","exacerbated","stable"}  Satisfied with current treatment? {Blank single:19197::"yes","no"} Duration of hypertension: {Blank single:19197::"chronic","months","years"} BP monitoring frequency:  {Blank single:19197::"not checking","rarely","daily","weekly","monthly","a few times a day","a few times a week","a few times a month"} BP range:  BP medication side effects:  {Blank single:19197::"yes","no"} Medication compliance: {Blank single:19197::"excellent compliance","good compliance","fair compliance","poor compliance"} Previous BP meds:{Blank [id number] (bystolic)","carvedilol","chlorthalidone","clonidine","diltiazem","exforge HCT","HCTZ","irbesartan (avapro)","labetalol","lisinopril","lisinopril-HCTZ","losartan (cozaar)","methyldopa","nifedipine","olmesartan (benicar)","olmesartan-HCTZ","quinapril","ramipril","spironalactone","tekturna","valsartan","valsartan-HCTZ","verapamil"} Aspirin: {Blank single:19197::"yes","no"}  The 10-year ASCVD risk score Mikey Bussing DC Jr., et al., 2013) is: 8.2%   Values used to calculate the score:     Age: 65 years     Sex: Male     Is Non-Hispanic African American: No     Diabetic: No     Tobacco smoker: No     Systolic Blood Pressure: 123456 mmHg     Is BP treated: Yes     HDL Cholesterol: 47 mg/dL     Total Cholesterol: 172 mg/dL  Recurrent headaches: {Blank single:19197::"yes","no"} Visual changes: {Blank single:19197::"yes","no"} Palpitations: {Blank single:19197::"yes","no"} Dyspnea: {Blank single:19197::"yes","no"} Chest pain: {Blank  single:19197::"yes","no"} Lower extremity edema: {Blank single:19197::"yes","no"} Dizzy/lightheaded: {Blank single:19197::"yes","no"}  No Known Allergies  Outpatient Encounter Medications as of 06/04/2019  Medication Sig  . losartan (COZAAR) 25 MG tablet Take 1 tablet (25 mg total) by mouth daily.  . metoprolol succinate (TOPROL-XL) 50 MG 24 hr tablet TAKE 1 TABLET BY MOUTH DAILY, TAKE WITH OR IMMEDIATELY FOLLOWING A MEAL  . pantoprazole (PROTONIX) 40 MG tablet Take 1 tablet (40 mg total) by mouth daily.  Marland Kitchen spironolactone (ALDACTONE) 25 MG tablet Take 1 tablet (25 mg total) by mouth daily.   No facility-administered encounter medications on file as of 06/04/2019.   Patient Active Problem List   Diagnosis Date Noted  . Cough due to ACE inhibitor 10/16/2018  . Meniere disease 04/17/2018  . Benign neoplasm of cecum   . Polyp of sigmoid colon   . GERD (gastroesophageal reflux disease) 03/08/2016  . 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 Hypertension    Past Medical History:  Diagnosis Date  . Cancer ([place])    skin  . GERD (gastroesophageal reflux disease)   . Hypertension   . Meniere disease   . Testicular hypofunction    Relevant past medical, surgical, family and social history reviewed and updated as indicated. Interim medical history since our last visit reviewed.  Review of Systems  Per HPI unless specifically indicated above     Objective:    There were no vitals taken for this visit.  Wt Readings from Last 3 Encounters:  05/21/19 190 lb (86.2 kg)  01/18/19 192 lb (87.1 kg)  04/17/18 195 lb (88.5 kg)    Physical Exam  Results for orders placed or performed in visit on 123XX123  Basic metabolic panel  Result Value Ref Range   Glucose 83 65 - 99 mg/dL   BUN 20 6 - 24 mg/dL   Creatinine, Ser 0.90 0.76 - 1.27 mg/dL   GFR calc non Af Amer 96 >59 mL/min/1.73   GFR calc Af Amer 111 >59 mL/min/1.73   BUN/Creatinine Ratio 22 (H) 9 - 20   Sodium 141 134 - 144 mmol/L    Potassium 4.1 3.5 - 5.2 mmol/L   Chloride 102 96 - 106 mmol/L  CO2 18 (L) 20 - 29 mmol/L   Calcium 9.6 8.7 - 10.2 mg/dL  Testosterone  Result Value Ref Range   Testosterone 451 264 - 916 ng/dL      Assessment & Plan:   Problem List Items Addressed This Visit    None       Follow up plan: No follow-ups on file.

## 2019-06-07 ENCOUNTER — Encounter: Payer: Self-pay | Admitting: Nurse Practitioner

## 2019-06-07 ENCOUNTER — Other Ambulatory Visit: Payer: Self-pay

## 2019-06-07 ENCOUNTER — Ambulatory Visit (INDEPENDENT_AMBULATORY_CARE_PROVIDER_SITE_OTHER): Payer: No Typology Code available for payment source | Admitting: Nurse Practitioner

## 2019-06-07 VITALS — BP 148/84 | HR 76 | Temp 97.8°F | Ht 70.0 in | Wt 203.8 lb

## 2019-06-07 DIAGNOSIS — I10 Essential (primary) hypertension: Secondary | ICD-10-CM | POA: Diagnosis not present

## 2019-06-07 MED ORDER — LOSARTAN POTASSIUM 50 MG PO TABS: 50.0000 mg | ORAL_TABLET | Freq: Every day | ORAL | 0 refills | Status: DC

## 2019-06-07 NOTE — Progress Notes (Signed)
BP (!) 148/84 (BP Location: Left Arm, Cuff Size: Large)   Pulse 76   Temp 97.8 F (36.6 C) (Oral)   Ht 5\' 10"  (1.778 m)   Wt 203 lb 12.8 oz (92.4 kg)   SpO2 97%   BMI 29.24 kg/m    Subjective:    Patient ID: Jorge Brown, male    DOB: [birth year], 59 y.o.   MRN: [id number]  HPI: Jorge Brown is a 59 y.o. male  Chief Complaint  Patient presents with  . Hypertension   HYPERTENSION Hypertension status: uncontrolled  Satisfied with current treatment? no Duration of hypertension: chronic BP monitoring frequency:  a few times a week BP range: 150/90s BP medication side effects:  no Medication compliance: excellent compliance Previous BP meds: amlodipine, benazepril, hctz, metoprolol Aspirin: no Recurrent headaches: no Visual changes: no Palpitations: no Dyspnea: no Chest pain: no Lower extremity edema: no Dizzy/lightheaded: no   No Known Allergies  Outpatient Encounter Medications as of 06/07/2019  Medication Sig  . losartan (COZAAR) 50 MG tablet Take 1 tablet (50 mg total) by mouth daily.  . pantoprazole (PROTONIX) 40 MG tablet Take 1 tablet (40 mg total) by mouth daily.  Marland Kitchen spironolactone (ALDACTONE) 25 MG tablet Take 1 tablet (25 mg total) by mouth daily.  . [DISCONTINUED] losartan (COZAAR) 25 MG tablet Take 1 tablet (25 mg total) by mouth daily.  . [DISCONTINUED] metoprolol succinate (TOPROL-XL) 50 MG 24 hr tablet TAKE 1 TABLET BY MOUTH DAILY, TAKE WITH OR IMMEDIATELY FOLLOWING A MEAL   No facility-administered encounter medications on file as of 06/07/2019.   Patient Active Problem List   Diagnosis Date Noted  . Cough due to ACE inhibitor 10/16/2018  . Meniere disease 04/17/2018  . Benign neoplasm of cecum   . Polyp of sigmoid colon   . GERD (gastroesophageal reflux disease) 03/08/2016  . 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 Hypertension     Past Medical History:  Diagnosis Date  . Cancer ([place])    skin  . GERD (gastroesophageal reflux disease)   .  Hypertension   . Meniere disease   . Testicular hypofunction    Relevant past medical, surgical, family and social history reviewed and updated as indicated. Interim medical history since our last visit reviewed.  Review of Systems  Constitutional: Negative.  Negative for activity change, fatigue and fever.  Eyes: Negative.  Negative for visual disturbance.  Respiratory: Negative.  Negative for cough, chest tightness, shortness of breath and wheezing.   Cardiovascular: Negative.  Negative for chest pain, palpitations and leg swelling.  Neurological: Negative.  Negative for dizziness, weakness, light-headedness and headaches.  Psychiatric/Behavioral: Negative.     Per HPI unless specifically indicated above     Objective:    BP (!) 148/84 (BP Location: Left Arm, Cuff Size: Large)   Pulse 76   Temp 97.8 F (36.6 C) (Oral)   Ht 5\' 10"  (1.778 m)   Wt 203 lb 12.8 oz (92.4 kg)   SpO2 97%   BMI 29.24 kg/m   Wt Readings from Last 3 Encounters:  06/07/19 203 lb 12.8 oz (92.4 kg)  05/21/19 190 lb (86.2 kg)  01/18/19 192 lb (87.1 kg)    Physical Exam Vitals and nursing note reviewed.  Constitutional:      General: He is not in acute distress.    Appearance: Normal appearance. He is not toxic-appearing.  Eyes:     General: No scleral icterus.    Extraocular Movements: Extraocular movements intact.  Pupils: Pupils are equal, round, and reactive to light.  Cardiovascular:     Rate and Rhythm: Normal rate and regular rhythm.     Heart sounds: Normal heart sounds. No murmur.  Pulmonary:     Effort: Pulmonary effort is normal. No respiratory distress.     Breath sounds: Normal breath sounds. No wheezing or rhonchi.  Neurological:     General: No focal deficit present.     Mental Status: He is alert and oriented to person, place, and time.     Motor: No weakness.     Gait: Gait normal.  Psychiatric:        Mood and Affect: Mood normal.        Behavior: Behavior normal.         Thought Content: Thought content normal.        Judgment: Judgment normal.     Results for orders placed or performed in visit on 123XX123  Basic metabolic panel  Result Value Ref Range   Glucose 83 65 - 99 mg/dL   BUN 20 6 - 24 mg/dL   Creatinine, Ser 0.90 0.76 - 1.27 mg/dL   GFR calc non Af Amer 96 >59 mL/min/1.73   GFR calc Af Amer 111 >59 mL/min/1.73   BUN/Creatinine Ratio 22 (H) 9 - 20   Sodium 141 134 - 144 mmol/L   Potassium 4.1 3.5 - 5.2 mmol/L   Chloride 102 96 - 106 mmol/L   CO2 18 (L) 20 - 29 mmol/L   Calcium 9.6 8.7 - 10.2 mg/dL  Testosterone  Result Value Ref Range   Testosterone 451 264 - 916 ng/dL      Assessment & Plan:   Problem List Items Addressed This Visit      Cardiovascular and Mediastinum   Hypertension - Primary    Chronic, uncontrolled.  Will check BMP and increase Losartan to 50mg  daily as long as electrolytes and kidney function stable.  If not stable, will consider other antihypertensive; this was discussed and the patient was in agreement.  Again discussed side effects of low potassium and when to seek emergency care.  Advised to continue to monitor BP at home and call office with any concerns.      Relevant Medications   losartan (COZAAR) 50 MG tablet   Other Relevant Orders   Basic Metabolic Panel (BMET)       Follow up plan: Return in about 4 weeks (around 07/05/2019) for blood pressure follow up, can be virtual;.

## 2019-06-07 NOTE — Patient Instructions (Signed)

## 2019-06-07 NOTE — Assessment & Plan Note (Signed)
Chronic, uncontrolled.  Will check BMP and increase Losartan to 50mg  daily as long as electrolytes and kidney function stable.  If not stable, will consider other antihypertensive; this was discussed and the patient was in agreement.  Again discussed side effects of low potassium and when to seek emergency care.  Advised to continue to monitor BP at home and call office with any concerns.

## 2019-06-08 LAB — BASIC METABOLIC PANEL
BUN/Creatinine Ratio: 15 (ref 9–20)
BUN: 17 mg/dL (ref 6–24)
CO2: 21 mmol/L (ref 20–29)
Calcium: 9.4 mg/dL (ref 8.7–10.2)
Chloride: 104 mmol/L (ref 96–106)
Creatinine, Ser: 1.11 mg/dL (ref 0.76–1.27)
GFR calc Af Amer: 85 mL/min/{1.73_m2} (ref >59)
GFR calc non Af Amer: 74 mL/min/{1.73_m2} (ref >59)
Glucose: 85 mg/dL (ref 65–99)
Potassium: 4.4 mmol/L (ref 3.5–5.2)
Sodium: 142 mmol/L (ref 134–144)

## 2019-07-06 ENCOUNTER — Ambulatory Visit: Payer: No Typology Code available for payment source | Admitting: Nurse Practitioner

## 2019-07-06 NOTE — Progress Notes (Deleted)
   There were no vitals taken for this visit.   Subjective:    Patient ID: Jorge Brown, male    DOB: 1960-06-28, 59 y.o.   MRN: [id number]  HPI: Jorge Brown is a 59 y.o. male  No chief complaint on file.  HYPERTENSION Recently stopped Metoprolol and started Losartan; last visit increased Losartan to 50mg  daily.   Hypertension status: {Blank single:19197::"controlled","uncontrolled","better","worse","exacerbated","stable"}  Satisfied with current treatment? {Blank single:19197::"yes","no"} Duration of hypertension: {Blank single:19197::"chronic","months","years"} BP monitoring frequency:  {Blank single:19197::"not checking","rarely","daily","weekly","monthly","a few times a day","a few times a week","a few times a month"} BP range:  BP medication side effects:  {Blank single:19197::"yes","no"} Medication compliance: {Blank single:19197::"excellent compliance","good compliance","fair compliance","poor compliance"} Previous BP meds:{Blank A999333 (bystolic)","carvedilol","chlorthalidone","clonidine","diltiazem","exforge HCT","HCTZ","irbesartan (avapro)","labetalol","lisinopril","lisinopril-HCTZ","losartan (cozaar)","methyldopa","nifedipine","olmesartan (benicar)","olmesartan-HCTZ","quinapril","ramipril","spironalactone","tekturna","valsartan","valsartan-HCTZ","verapamil"} Aspirin: {Blank single:19197::"yes","no"} Recurrent headaches: {Blank single:19197::"yes","no"} Visual changes: {Blank single:19197::"yes","no"} Palpitations: {Blank single:19197::"yes","no"} Dyspnea: {Blank single:19197::"yes","no"} Chest pain: {Blank single:19197::"yes","no"} Lower extremity edema: {Blank single:19197::"yes","no"} Dizzy/lightheaded: {Blank single:19197::"yes","no"}  No Known Allergies  Outpatient Encounter Medications as of 07/06/2019  Medication Sig  . losartan (COZAAR) 50 MG tablet Take 1  tablet (50 mg total) by mouth daily.  . pantoprazole (PROTONIX) 40 MG tablet Take 1 tablet (40 mg total) by mouth daily.  Marland Kitchen spironolactone (ALDACTONE) 25 MG tablet Take 1 tablet (25 mg total) by mouth daily.   No facility-administered encounter medications on file as of 07/06/2019.   Patient Active Problem List   Diagnosis Date Noted  . Cough due to ACE inhibitor 10/16/2018  . Meniere disease 04/17/2018  . Benign neoplasm of cecum   . Polyp of sigmoid colon   . GERD (gastroesophageal reflux disease) 03/08/2016  . 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 Hypertension    Past Medical History:  Diagnosis Date  . Cancer ([place])    skin  . GERD (gastroesophageal reflux disease)   . Hypertension   . Meniere disease   . Testicular hypofunction    Relevant past medical, surgical, family and social history reviewed and updated as indicated. Interim medical history since our last visit reviewed.  Review of Systems  Per HPI unless specifically indicated above     Objective:    There were no vitals taken for this visit.  Wt Readings from Last 3 Encounters:  06/07/19 203 lb 12.8 oz (92.4 kg)  05/21/19 190 lb (86.2 kg)  01/18/19 192 lb (87.1 kg)    Physical Exam  Results for orders placed or performed in visit on 99991111  Basic Metabolic Panel (BMET)  Result Value Ref Range   Glucose 85 65 - 99 mg/dL   BUN 17 6 - 24 mg/dL   Creatinine, Ser 1.11 0.76 - 1.27 mg/dL   GFR calc non Af Amer 74 >59 mL/min/1.73   GFR calc Af Amer 85 >59 mL/min/1.73   BUN/Creatinine Ratio 15 9 - 20   Sodium 142 134 - 144 mmol/L   Potassium 4.4 3.5 - 5.2 mmol/L   Chloride 104 96 - 106 mmol/L   CO2 21 20 - 29 mmol/L   Calcium 9.4 8.7 - 10.2 mg/dL      Assessment & Plan:   Problem List Items Addressed This Visit    None       Follow up plan: No follow-ups on file.

## 2019-07-19 ENCOUNTER — Ambulatory Visit (INDEPENDENT_AMBULATORY_CARE_PROVIDER_SITE_OTHER): Payer: No Typology Code available for payment source | Admitting: Nurse Practitioner

## 2019-07-19 ENCOUNTER — Other Ambulatory Visit: Payer: Self-pay

## 2019-07-19 ENCOUNTER — Encounter: Payer: Self-pay | Admitting: Nurse Practitioner

## 2019-07-19 VITALS — BP 138/87 | HR 72 | Temp 97.6°F | Wt 191.1 lb

## 2019-07-19 DIAGNOSIS — I10 Essential (primary) hypertension: Secondary | ICD-10-CM

## 2019-07-19 MED ORDER — LOSARTAN POTASSIUM 25 MG PO TABS: 12.5000 mg | ORAL_TABLET | Freq: Every day | ORAL | 0 refills | Status: DC

## 2019-07-19 MED ORDER — LOSARTAN POTASSIUM 50 MG PO TABS: 50.0000 mg | ORAL_TABLET | Freq: Every day | ORAL | 0 refills | Status: DC

## 2019-07-19 NOTE — Progress Notes (Signed)
BP 138/87 (BP Location: Left Arm, Patient Position: Sitting, Cuff Size: Normal)   Pulse 72   Temp 97.6 F (36.4 C)   Wt 191 lb 2 oz (86.7 kg)   SpO2 98%   BMI 27.42 kg/m    Subjective:    Patient ID: Jorge Brown, male    DOB: 01-Jan-1961, 59 y.o.   MRN: [id number]  HPI: Jorge Brown is a 59 y.o. male presenting for blood pressure follow up.  Chief Complaint  Patient presents with  . Medication Refill    BP recheck for med refill   HYPERTENSION Currently taking Losartan 50 mg daily for blood pressure Hypertension status: controlled  Satisfied with current treatment? yes Duration of hypertension: chronic BP monitoring frequency:  daily BP range: mid 130s/80s BP medication side effects:  no Medication compliance: excellent compliance Previous BP meds: metoprolol, losartan Aspirin: no Recurrent headaches: no Visual changes: no Palpitations: no Dyspnea: no Chest pain: no Lower extremity edema: no Dizzy/lightheaded: no  No Known Allergies  Outpatient Encounter Medications as of 07/19/2019  Medication Sig  . losartan (COZAAR) 50 MG tablet Take 1 tablet (50 mg total) by mouth daily.  . pantoprazole (PROTONIX) 40 MG tablet Take 1 tablet (40 mg total) by mouth daily.  Marland Kitchen spironolactone (ALDACTONE) 25 MG tablet Take 1 tablet (25 mg total) by mouth daily.  . [DISCONTINUED] losartan (COZAAR) 50 MG tablet Take 1 tablet (50 mg total) by mouth daily.  Marland Kitchen losartan (COZAAR) 25 MG tablet Take 0.5 tablets (12.5 mg total) by mouth daily.   No facility-administered encounter medications on file as of 07/19/2019.   Patient Active Problem List   Diagnosis Date Noted  . Cough due to ACE inhibitor 10/16/2018  . Meniere disease 04/17/2018  . Benign neoplasm of cecum   . Polyp of sigmoid colon   . GERD (gastroesophageal reflux disease) 03/08/2016  . 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 Hypertension    Past Medical History:  Diagnosis Date  . Cancer ([place])    skin  . GERD  (gastroesophageal reflux disease)   . Hypertension   . Meniere disease   . Testicular hypofunction    Relevant past medical, surgical, family and social history reviewed and updated as indicated. Interim medical history since our last visit reviewed.  Review of Systems  Constitutional: Negative.  Negative for activity change, appetite change, fatigue and fever.  Eyes: Negative.  Negative for visual disturbance.  Respiratory: Negative.  Negative for shortness of breath.   Cardiovascular: Negative.  Negative for chest pain, palpitations and leg swelling.  Skin: Negative.   Neurological: Negative.  Negative for dizziness, weakness, light-headedness and headaches.  Psychiatric/Behavioral: Negative.     Per HPI unless specifically indicated above     Objective:    BP 138/87 (BP Location: Left Arm, Patient Position: Sitting, Cuff Size: Normal)   Pulse 72   Temp 97.6 F (36.4 C)   Wt 191 lb 2 oz (86.7 kg)   SpO2 98%   BMI 27.42 kg/m   Wt Readings from Last 3 Encounters:  07/19/19 191 lb 2 oz (86.7 kg)  06/07/19 203 lb 12.8 oz (92.4 kg)  05/21/19 190 lb (86.2 kg)    Physical Exam Constitutional:      General: He is not in acute distress.    Appearance: Normal appearance. He is not toxic-appearing.  Eyes:     General: No scleral icterus.    Extraocular Movements: Extraocular movements intact.  Cardiovascular:     Rate and  Rhythm: Normal rate and regular rhythm.     Heart sounds: Normal heart sounds.  Pulmonary:     Effort: Pulmonary effort is normal. No respiratory distress.     Breath sounds: Normal breath sounds. No wheezing or rhonchi.  Skin:    General: Skin is warm and dry.     Coloration: Skin is not jaundiced or pale.  Neurological:     General: No focal deficit present.     Mental Status: He is alert and oriented to person, place, and time.     Motor: No weakness.     Gait: Gait normal.  Psychiatric:        Mood and Affect: Mood normal.        Behavior:  Behavior normal.        Thought Content: Thought content normal.        Judgment: Judgment normal.     Results for orders placed or performed in visit on 123456  Basic Metabolic Panel (BMET)  Result Value Ref Range   Glucose 95 65 - 99 mg/dL   BUN 17 6 - 24 mg/dL   Creatinine, Ser 0.90 0.76 - 1.27 mg/dL   GFR calc non Af Amer 95 >59 mL/min/1.73   GFR calc Af Amer 110 >59 mL/min/1.73   BUN/Creatinine Ratio 19 9 - 20   Sodium 140 134 - 144 mmol/L   Potassium 3.7 3.5 - 5.2 mmol/L   Chloride 104 96 - 106 mmol/L   CO2 22 20 - 29 mmol/L   Calcium 9.6 8.7 - 10.2 mg/dL      Assessment & Plan:   Problem List Items Addressed This Visit      Cardiovascular and Mediastinum   Hypertension - Primary    Chronic, ongoing.  BP better, however continues to be higher than goal in office and at home.  After discussion, with patient, agreeable to increase Losartan to 62.5 mg daily and may increase to 75 mg daily after 2 weeks if BP maintains higher than 130/80.  BMP checked today to check electrolytes and kidney function.  Will follow up in 2 months for BP check.  Patient advised to return or call to clinic sooner if BP >140/90.  Discussed side effects of increased potassium.      Relevant Medications   losartan (COZAAR) 25 MG tablet   losartan (COZAAR) 50 MG tablet   Other Relevant Orders   Basic Metabolic Panel (BMET) (Completed)       Follow up plan: Return in about 2 months (around 09/18/2019) for BP follow up.

## 2019-07-19 NOTE — Patient Instructions (Signed)
DASH Eating Plan DASH stands for "Dietary Approaches to Stop Hypertension." The DASH eating plan is a healthy eating plan that has been shown to reduce high blood pressure (hypertension). It may also reduce your risk for type 2 diabetes, heart disease, and stroke. The DASH eating plan may also help with weight loss. What are tips for following this plan?  General guidelines  Avoid eating more than 2,300 mg (milligrams) of salt (sodium) a day. If you have hypertension, you may need to reduce your sodium intake to 1,500 mg a day.  Limit alcohol intake to no more than 1 drink a day for nonpregnant women and 2 drinks a day for men. One drink equals 12 oz of beer, 5 oz of wine, or 1 oz of hard liquor.  Work with your health care provider to maintain a healthy body weight or to lose weight. Ask what an ideal weight is for you.  Get at least 30 minutes of exercise that causes your heart to beat faster (aerobic exercise) most days of the week. Activities may include walking, swimming, or biking.  Work with your health care provider or diet and nutrition specialist (dietitian) to adjust your eating plan to your individual calorie needs. Reading food labels   Check food labels for the amount of sodium per serving. Choose foods with less than 5 percent of the Daily Value of sodium. Generally, foods with less than 300 mg of sodium per serving fit into this eating plan.  To find whole grains, look for the word "whole" as the first word in the ingredient list. Shopping  Buy products labeled as "low-sodium" or "no salt added."  Buy fresh foods. Avoid canned foods and premade or frozen meals. Cooking  Avoid adding salt when cooking. Use salt-free seasonings or herbs instead of table salt or sea salt. Check with your health care provider or pharmacist before using salt substitutes.  Do not fry foods. Cook foods using healthy methods such as baking, boiling, grilling, and broiling instead.  Cook with  heart-healthy oils, such as olive, canola, soybean, or sunflower oil. Meal planning  Eat a balanced diet that includes: ? 5 or more servings of fruits and vegetables each day. At each meal, try to fill half of your plate with fruits and vegetables. ? Up to 6-8 servings of whole grains each day. ? Less than 6 oz of lean meat, poultry, or fish each day. A 3-oz serving of meat is about the same size as a deck of cards. One egg equals 1 oz. ? 2 servings of low-fat dairy each day. ? A serving of nuts, seeds, or beans 5 times each week. ? Heart-healthy fats. Healthy fats called Omega-3 fatty acids are found in foods such as flaxseeds and coldwater fish, like sardines, salmon, and mackerel.  Limit how much you eat of the following: ? Canned or prepackaged foods. ? Food that is high in trans fat, such as fried foods. ? Food that is high in saturated fat, such as fatty meat. ? Sweets, desserts, sugary drinks, and other foods with added sugar. ? Full-fat dairy products.  Do not salt foods before eating.  Try to eat at least 2 vegetarian meals each week.  Eat more home-cooked food and less restaurant, buffet, and fast food.  When eating at a restaurant, ask that your food be prepared with less salt or no salt, if possible. What foods are recommended? The items listed may not be a complete list. Talk with your dietitian about   what dietary choices are best for you. Grains Whole-grain or whole-wheat bread. Whole-grain or whole-wheat pasta. Brown rice. Oatmeal. Quinoa. Bulgur. Whole-grain and low-sodium cereals. Pita bread. Low-fat, low-sodium crackers. Whole-wheat flour tortillas. Vegetables Fresh or frozen vegetables (raw, steamed, roasted, or grilled). Low-sodium or reduced-sodium tomato and vegetable juice. Low-sodium or reduced-sodium tomato sauce and tomato paste. Low-sodium or reduced-sodium canned vegetables. Fruits All fresh, dried, or frozen fruit. Canned fruit in natural juice (without  added sugar). Meat and other protein foods Skinless chicken or turkey. Ground chicken or turkey. Pork with fat trimmed off. Fish and seafood. Egg whites. Dried beans, peas, or lentils. Unsalted nuts, nut butters, and seeds. Unsalted canned beans. Lean cuts of beef with fat trimmed off. Low-sodium, lean deli meat. Dairy Low-fat (1%) or fat-free (skim) milk. Fat-free, low-fat, or reduced-fat cheeses. Nonfat, low-sodium ricotta or cottage cheese. Low-fat or nonfat yogurt. Low-fat, low-sodium cheese. Fats and oils Soft margarine without trans fats. Vegetable oil. Low-fat, reduced-fat, or light mayonnaise and salad dressings (reduced-sodium). Canola, safflower, olive, soybean, and sunflower oils. Avocado. Seasoning and other foods Herbs. Spices. Seasoning mixes without salt. Unsalted popcorn and pretzels. Fat-free sweets. What foods are not recommended? The items listed may not be a complete list. Talk with your dietitian about what dietary choices are best for you. Grains Baked goods made with fat, such as croissants, muffins, or some breads. Dry pasta or rice meal packs. Vegetables Creamed or fried vegetables. Vegetables in a cheese sauce. Regular canned vegetables (not low-sodium or reduced-sodium). Regular canned tomato sauce and paste (not low-sodium or reduced-sodium). Regular tomato and vegetable juice (not low-sodium or reduced-sodium). Pickles. Olives. Fruits Canned fruit in a light or heavy syrup. Fried fruit. Fruit in cream or butter sauce. Meat and other protein foods Fatty cuts of meat. Ribs. Fried meat. Bacon. Sausage. Bologna and other processed lunch meats. Salami. Fatback. Hotdogs. Bratwurst. Salted nuts and seeds. Canned beans with added salt. Canned or smoked fish. Whole eggs or egg yolks. Chicken or turkey with skin. Dairy Whole or 2% milk, cream, and half-and-half. Whole or full-fat cream cheese. Whole-fat or sweetened yogurt. Full-fat cheese. Nondairy creamers. Whipped toppings.  Processed cheese and cheese spreads. Fats and oils Butter. Stick margarine. Lard. Shortening. Ghee. Bacon fat. Tropical oils, such as coconut, palm kernel, or palm oil. Seasoning and other foods Salted popcorn and pretzels. Onion salt, garlic salt, seasoned salt, table salt, and sea salt. Worcestershire sauce. Tartar sauce. Barbecue sauce. Teriyaki sauce. Soy sauce, including reduced-sodium. Steak sauce. Canned and packaged gravies. Fish sauce. Oyster sauce. Cocktail sauce. Horseradish that you find on the shelf. Ketchup. Mustard. Meat flavorings and tenderizers. Bouillon cubes. Hot sauce and Tabasco sauce. Premade or packaged marinades. Premade or packaged taco seasonings. Relishes. Regular salad dressings. Where to find more information:  National Heart, Lung, and Blood Institute: www.nhlbi.nih.gov  American Heart Association: www.heart.org Summary  The DASH eating plan is a healthy eating plan that has been shown to reduce high blood pressure (hypertension). It may also reduce your risk for type 2 diabetes, heart disease, and stroke.  With the DASH eating plan, you should limit salt (sodium) intake to 2,300 mg a day. If you have hypertension, you may need to reduce your sodium intake to 1,500 mg a day.  When on the DASH eating plan, aim to eat more fresh fruits and vegetables, whole grains, lean proteins, low-fat dairy, and heart-healthy fats.  Work with your health care provider or diet and nutrition specialist (dietitian) to adjust your eating plan to your   individual calorie needs. This information is not intended to replace advice given to you by your health care provider. Make sure you discuss any questions you have with your health care provider. Document Revised: 03/04/2017 Document Reviewed: 03/15/2016 Elsevier Patient Education  2020 Elsevier Inc.  

## 2019-07-20 LAB — BASIC METABOLIC PANEL
BUN/Creatinine Ratio: 19 (ref 9–20)
BUN: 17 mg/dL (ref 6–24)
CO2: 22 mmol/L (ref 20–29)
Calcium: 9.6 mg/dL (ref 8.7–10.2)
Chloride: 104 mmol/L (ref 96–106)
Creatinine, Ser: 0.9 mg/dL (ref 0.76–1.27)
GFR calc Af Amer: 110 mL/min/{1.73_m2} (ref >59)
GFR calc non Af Amer: 95 mL/min/{1.73_m2} (ref >59)
Glucose: 95 mg/dL (ref 65–99)
Potassium: 3.7 mmol/L (ref 3.5–5.2)
Sodium: 140 mmol/L (ref 134–144)

## 2019-07-20 NOTE — Assessment & Plan Note (Signed)
Chronic, ongoing.  BP better, however continues to be higher than goal in office and at home.  After discussion, with patient, agreeable to increase Losartan to 62.5 mg daily and may increase to 75 mg daily after 2 weeks if BP maintains higher than 130/80.  BMP checked today to check electrolytes and kidney function.  Will follow up in 2 months for BP check.  Patient advised to return or call to clinic sooner if BP >140/90.  Discussed side effects of increased potassium.

## 2019-09-13 ENCOUNTER — Telehealth: Payer: Self-pay

## 2019-09-13 ENCOUNTER — Other Ambulatory Visit: Payer: Self-pay | Admitting: Family Medicine

## 2019-09-13 DIAGNOSIS — I10 Essential (primary) hypertension: Secondary | ICD-10-CM

## 2019-09-13 MED ORDER — LOSARTAN POTASSIUM 25 MG PO TABS: 12.5000 mg | ORAL_TABLET | Freq: Every day | ORAL | 0 refills | Status: DC

## 2019-09-13 MED ORDER — LOSARTAN POTASSIUM 50 MG PO TABS: 50.0000 mg | ORAL_TABLET | Freq: Every day | ORAL | 0 refills | Status: DC

## 2019-09-13 NOTE — Telephone Encounter (Signed)
[place] and let them know of Dr. Durenda Age message. The pharmacist stated that patient was aware that he was supposed to be taking 62.5 mg

## 2019-09-13 NOTE — Telephone Encounter (Signed)
He's supposed to take both of them for a total of 62.5mg 

## 2019-09-13 NOTE — Telephone Encounter (Signed)
Requested Prescriptions  Pending Prescriptions Disp Refills  . losartan (COZAAR) 50 MG tablet 60 tablet 0    Sig: Take 1 tablet (50 mg total) by mouth daily.     Cardiovascular:  Angiotensin Receptor Blockers Passed - 09/13/2019  1:26 PM      Passed - Cr in normal range and within 180 days    Creatinine, Ser  Date Value Ref Range Status  07/19/2019 0.90 0.76 - 1.27 mg/dL Final         Passed - K in normal range and within 180 days    Potassium  Date Value Ref Range Status  07/19/2019 3.7 3.5 - 5.2 mmol/L Final         Passed - Patient is not pregnant      Passed - Last BP in normal range    BP Readings from Last 1 Encounters:  07/19/19 138/87         Passed - Valid encounter within last 6 months    Recent Outpatient Visits          1 month ago Essential hypertension   [place], Jessica I, NP   3 months ago Essential hypertension   [place], Jessica I, NP   3 months ago Essential hypertension   [place], Jessica I, NP   7 months ago 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 losartan (COZAAR) 25 MG tablet 60 tablet 0    Sig: Take 0.5 tablets (12.5 mg total) by mouth daily.     Cardiovascular:  Angiotensin Receptor Blockers Passed - 09/13/2019  1:26 PM      Passed - Cr in normal range and within 180 days    Creatinine, Ser  Date Value Ref Range Status  07/19/2019 0.90 0.76 - 1.27 mg/dL Final         Passed - K in normal range and within 180 days    Potassium  Date Value Ref Range Status  07/19/2019 3.7 3.5 - 5.2 mmol/L Final         Passed - Patient is not pregnant      Passed - Last BP in normal range    BP Readings from Last 1 Encounters:  07/19/19 138/87         Passed - Valid encounter within last 6 months    Recent  Outpatient Visits          1 month ago Essential hypertension   [place], Jessica I, NP   3 months ago Essential hypertension   [place], Jessica I, NP   3 months ago Essential hypertension   [place], Jessica I, NP   7 months ago 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

## 2019-09-13 NOTE — Telephone Encounter (Signed)
Incoming call from [facility], two different dosages of losartan was sent over today, they need to know what dose they need to fill.

## 2019-09-13 NOTE — Telephone Encounter (Signed)
Medication Refill - Medication: losartan   Has the patient contacted their pharmacy? Yes.   (Agent: If no, request that the patient contact the pharmacy for the refill.) (Agent: If yes, when and what did the pharmacy advise?)  Preferred Pharmacy (with phone number or street name):  [place], [address]  [place] [address]  Phone: [phone] Fax: [phone]  Hours: Not open 24 hours     Agent: Please be advised that RX refills may take up to 3 business days. We ask that you follow-up with your pharmacy.

## 2019-11-29 ENCOUNTER — Encounter: Payer: Self-pay | Admitting: Unknown Physician Specialty

## 2019-11-29 ENCOUNTER — Other Ambulatory Visit: Payer: Self-pay

## 2019-11-29 ENCOUNTER — Ambulatory Visit (INDEPENDENT_AMBULATORY_CARE_PROVIDER_SITE_OTHER): Payer: Self-pay | Admitting: Unknown Physician Specialty

## 2019-11-29 VITALS — BP 126/78 | HR 68 | Temp 98.9°F | Ht 69.0 in | Wt 190.0 lb

## 2019-11-29 DIAGNOSIS — Z024 Encounter for examination for driving license: Secondary | ICD-10-CM

## 2019-11-29 DIAGNOSIS — I10 Essential (primary) hypertension: Secondary | ICD-10-CM

## 2019-11-29 MED ORDER — LOSARTAN POTASSIUM 50 MG PO TABS: 50.0000 mg | ORAL_TABLET | Freq: Every day | ORAL | 1 refills | Status: DC

## 2019-11-29 MED ORDER — LOSARTAN POTASSIUM 25 MG PO TABS: 12.5000 mg | ORAL_TABLET | Freq: Every day | ORAL | 1 refills | Status: DC

## 2019-11-29 NOTE — Assessment & Plan Note (Signed)
Excellent control.  Continue present medications.

## 2019-11-29 NOTE — Progress Notes (Signed)
   BP 126/78   Pulse 68   Temp 98.9 F (37.2 C) (Oral)   Ht 5\' 9"  (1.753 m)   Wt 190 lb (86.2 kg)   SpO2 98%   BMI 28.06 kg/m    Subjective:    Patient ID: Jorge Brown, male    DOB: May 22, 1960, 59 y.o.   MRN: [id number]  HPI: Jorge Brown is a 59 y.o. male  Chief Complaint  Patient presents with  . DOT Physical  . 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    See form for medications and health conditions.    Relevant past medical, surgical, family and social history reviewed and updated as indicated. Interim medical history since our last visit reviewed. Allergies and medications reviewed and updated.  Review of Systems  Constitutional: Negative.   HENT: Negative.   Eyes: Negative.   Respiratory: Negative.   Cardiovascular: Negative.   Gastrointestinal: Negative.   Endocrine: Negative.   Genitourinary: Negative.   Skin: Negative.   Allergic/Immunologic: Negative.   Neurological: Negative.   Hematological: Negative.   Psychiatric/Behavioral: Negative.     Per HPI unless specifically indicated above     Objective:    BP 126/78   Pulse 68   Temp 98.9 F (37.2 C) (Oral)   Ht 5\' 9"  (1.753 m)   Wt 190 lb (86.2 kg)   SpO2 98%   BMI 28.06 kg/m   Wt Readings from Last 3 Encounters:  11/29/19 190 lb (86.2 kg)  07/19/19 191 lb 2 oz (86.7 kg)  06/07/19 203 lb 12.8 oz (92.4 kg)    Physical Exam  See form Results for orders placed or performed in visit on 49/82/64  Basic Metabolic Panel (BMET)  Result Value Ref Range   Glucose 95 65 - 99 mg/dL   BUN 17 6 - 24 mg/dL   Creatinine, Ser 0.90 0.76 - 1.27 mg/dL   GFR calc non Af Amer 95 >59 mL/min/1.73   GFR calc Af Amer 110 >59 mL/min/1.73   BUN/Creatinine Ratio 19 9 - 20   Sodium 140 134 - 144 mmol/L   Potassium 3.7 3.5 - 5.2 mmol/L   Chloride 104 96 - 106 mmol/L   CO2 22 20 - 29 mmol/L   Calcium 9.6 8.7 - 10.2 mg/dL      Assessment & Plan:   Problem List Items Addressed This Visit       Unprioritized   Hypertension    Excellent control.  Continue present medications.        Relevant Medications   losartan (COZAAR) 50 MG tablet   losartan (COZAAR) 25 MG tablet    Other Visit Diagnoses    Encounter for commercial driver medical examination (CDME)    -  Primary      See form.  Good for 1 year Follow up plan: No follow-ups on file.

## 2019-12-16 IMAGING — CR CHEST - 2 VIEW
2 series · 2 of 2 positions shown · non-contrast
Comparison: None.

CLINICAL DATA: 57-year-old male with a history of cough

EXAM:
CHEST - 2 VIEW

[chest pa]
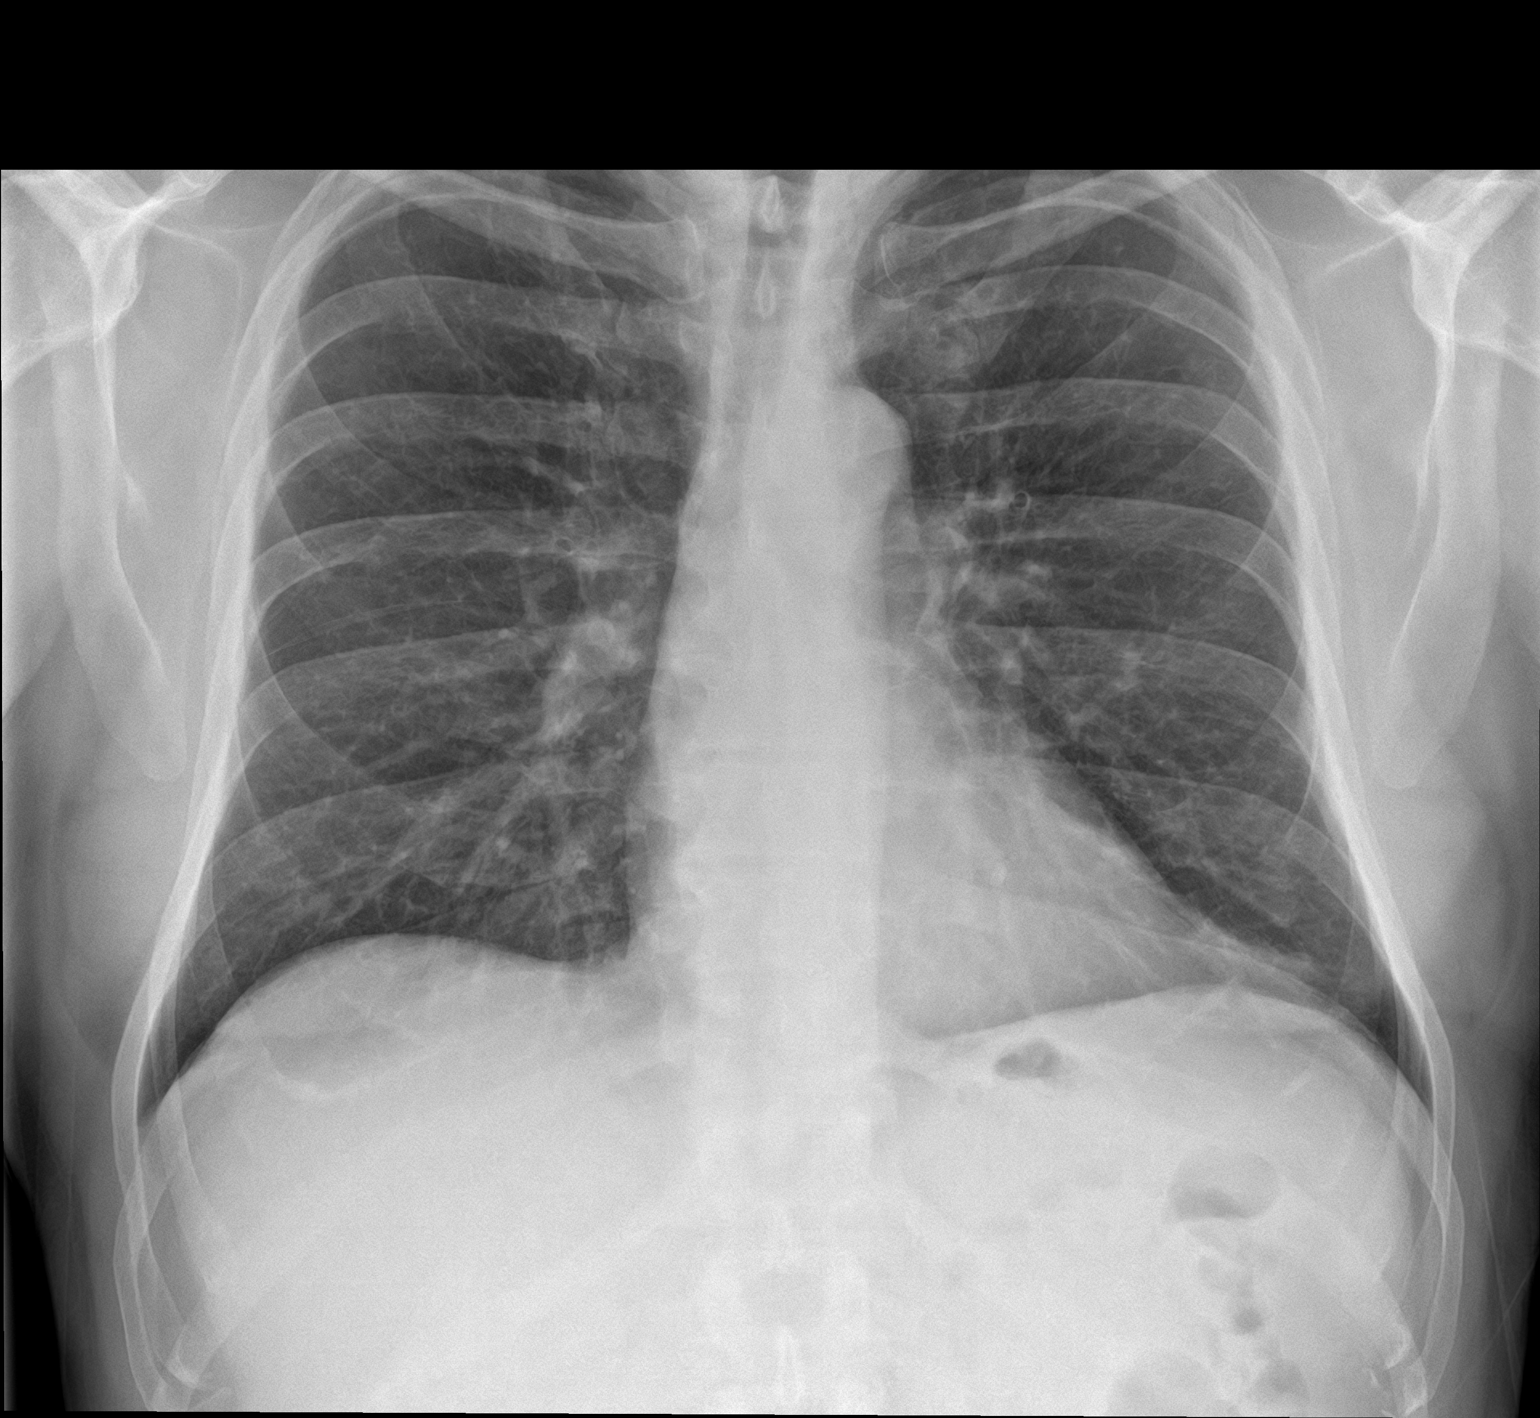

[chest lat]
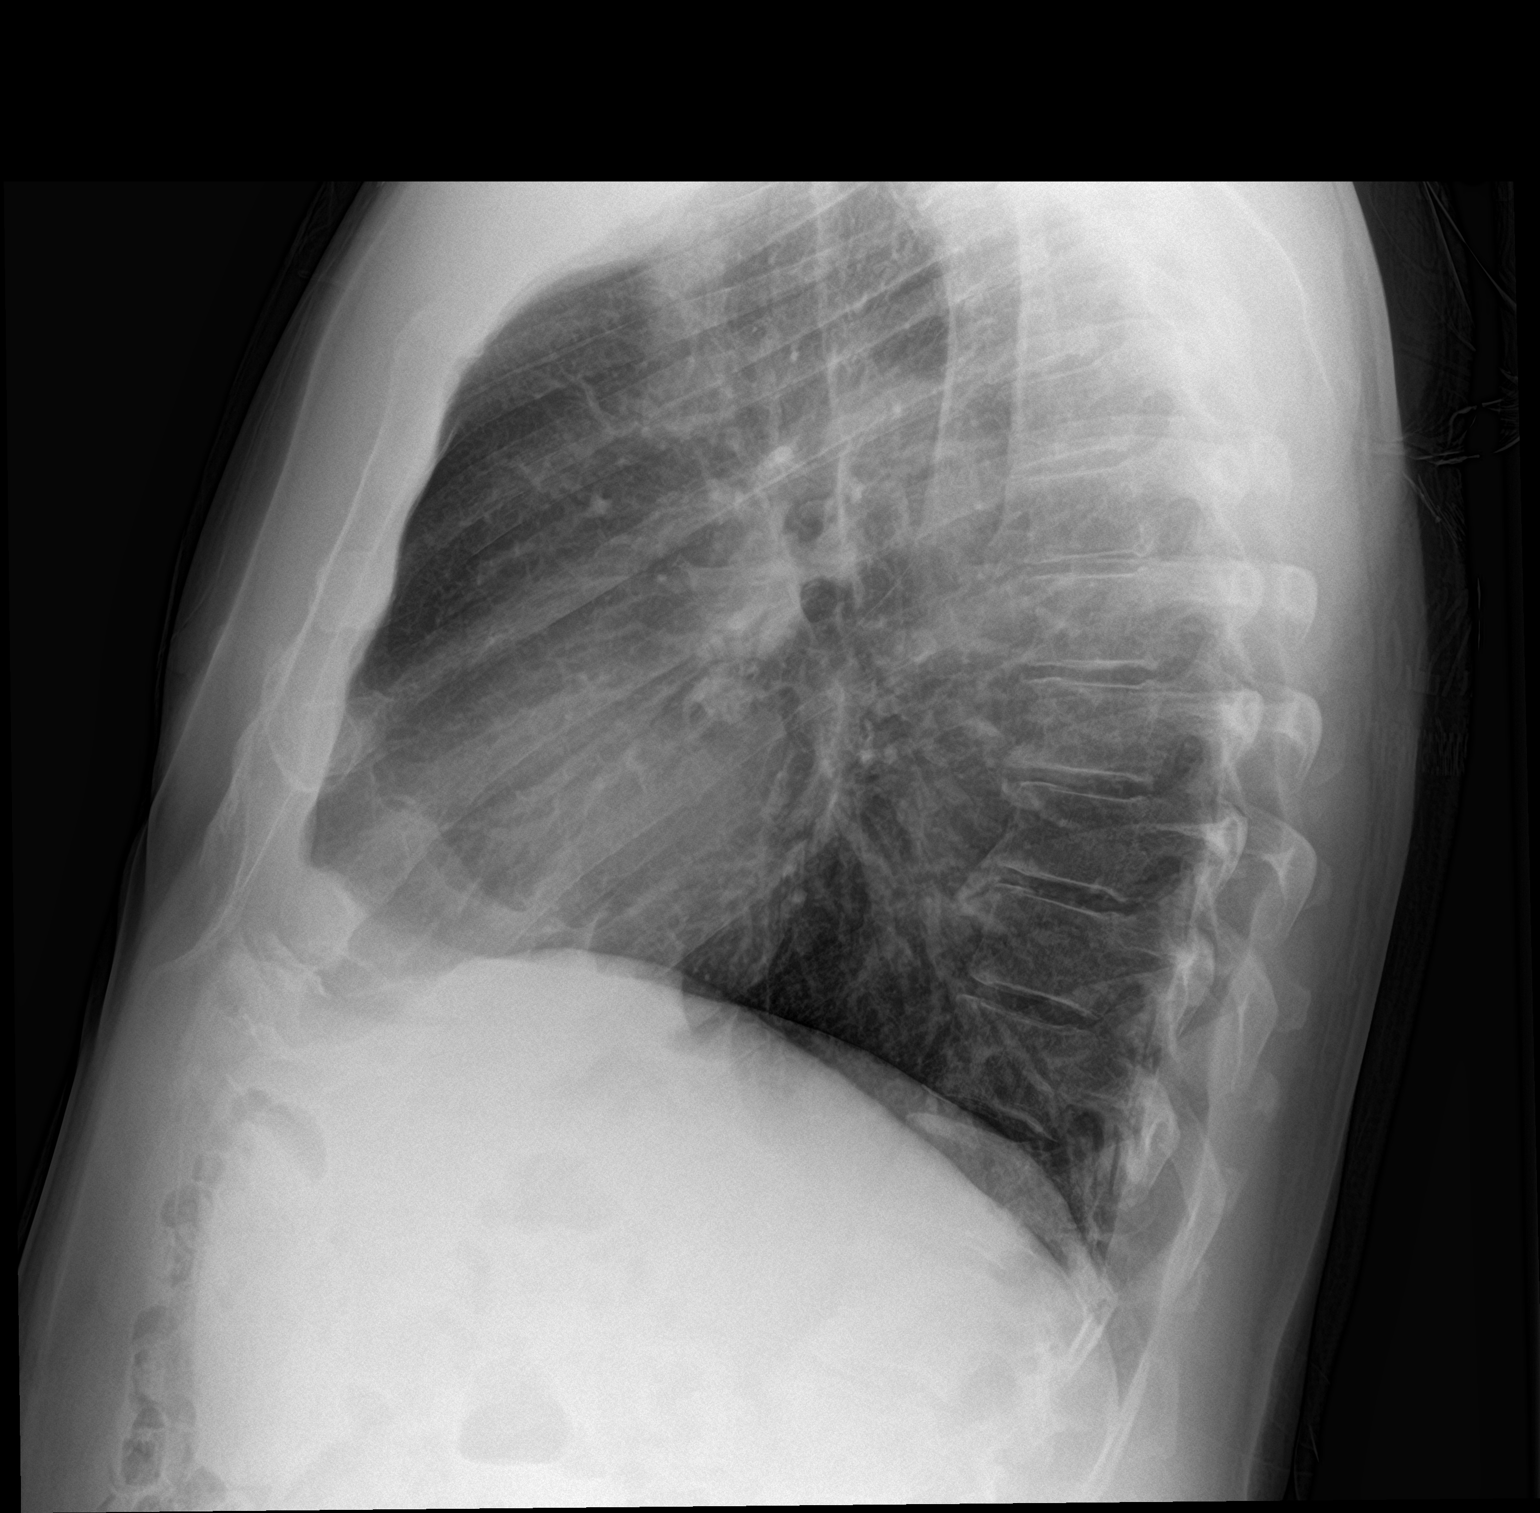

[2 of 2 positions shown; findings below may reference images not displayed]

FINDINGS: The heart size and mediastinal contours are within normal limits.
Both lungs are clear. The visualized skeletal structures are
unremarkable.
IMPRESSION: Negative for acute cardiopulmonary disease

## 2020-06-04 ENCOUNTER — Other Ambulatory Visit: Payer: Self-pay | Admitting: Unknown Physician Specialty

## 2020-06-04 DIAGNOSIS — I10 Essential (primary) hypertension: Secondary | ICD-10-CM

## 2020-06-04 NOTE — Telephone Encounter (Signed)
Requested medication (s) are due for refill today: yes  Requested medication (s) are on the active medication list: yes   Future visit scheduled: no  Notes to clinic: overdue for follow up appointment  Message sent for patient to contact office   Requested Prescriptions  Pending Prescriptions Disp Refills   losartan (COZAAR) 25 MG tablet [Pharmacy Med Name: LOSARTAN POTASSIUM 25 MG TAB] 45 tablet 0    Sig: Take 0.5 tablets (12.5 mg total) by mouth daily.      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 Eulogio Bear, NP   12 months ago Essential hypertension   [facility] Eulogio Bear, NP   1 year ago Essential hypertension   [facility] Eulogio Bear, NP   1 year ago Encounter for commercial driver medical examination (CDME)   [facility] Kathrine Haddock, NP                Passed - Patient is not pregnant      Passed - Last BP in normal range    BP Readings from Last 1 Encounters:  11/29/19 126/78            losartan (COZAAR) 50 MG tablet [Pharmacy Med Name: LOSARTAN POTASSIUM 50 MG TAB] 90 tablet 0    Sig: Take 1 tablet (50 mg total) by mouth daily.      Cardiovascular:  Angiotensin Receptor Blockers Failed - 06/04/2020  8:38 AM      Failed - Cr in normal range and within 180 days    Creatinine, Ser  Date Value Ref  Range Status  07/19/2019 0.90 0.76 - 1.27 mg/dL Final          Failed - K in normal range and within 180 days    Potassium  Date Value Ref Range Status  07/19/2019 3.7 3.5 - 5.2 mmol/L Final          Failed - Valid encounter within last 6 months    Recent Outpatient Visits           6 months ago Encounter for commercial driver medical examination (CDME)   [place] Kathrine Haddock, NP   10 months ago Essential hypertension   [facility] Eulogio Bear, NP   12 months ago Essential hypertension   [facility] Eulogio Bear, NP   1 year ago Essential hypertension   [facility] Eulogio Bear, NP   1 year ago Encounter for commercial driver medical examination (CDME)   [facility] Kathrine Haddock, NP                Passed - Patient is  not pregnant      Passed - Last BP in normal range    BP Readings from Last 1 Encounters:  11/29/19 126/78

## 2020-06-09 ENCOUNTER — Other Ambulatory Visit: Payer: Self-pay

## 2020-06-09 DIAGNOSIS — I10 Essential (primary) hypertension: Secondary | ICD-10-CM

## 2020-06-09 MED ORDER — LOSARTAN POTASSIUM 50 MG PO TABS: 50.0000 mg | ORAL_TABLET | Freq: Every day | ORAL | 0 refills | Status: AC

## 2020-06-09 NOTE — Telephone Encounter (Signed)
Caller name:  Emilio, Baylock B Relation to pt: spouse  Call back number: [phone]  Pharmacy: [place], [place] Phone:  [phone]  Fax:  [phone]       Reason for call:  Checking on the status of losartan (COZAAR) 25 MG tablet and losartan (COZAAR) 50 MG tablet.

## 2020-06-09 NOTE — Telephone Encounter (Signed)
Refill request from [employer] for Losartan Potassium 50 mg tab QD

## 2022-02-10 ENCOUNTER — Telehealth: Payer: Self-pay | Admitting: Gastroenterology

## 2022-02-10 NOTE — Telephone Encounter (Signed)
Pt medical records for date of service 07/23/2016 were sent to [facility]  [phone]

## 2022-04-15 ENCOUNTER — Other Ambulatory Visit: Payer: Self-pay | Admitting: Internal Medicine

## 2022-04-15 DIAGNOSIS — I10 Essential (primary) hypertension: Secondary | ICD-10-CM

## 2022-04-15 DIAGNOSIS — D582 Other hemoglobinopathies: Secondary | ICD-10-CM

## 2022-04-15 DIAGNOSIS — E785 Hyperlipidemia, unspecified: Secondary | ICD-10-CM

## 2022-04-28 ENCOUNTER — Ambulatory Visit
Admission: RE | Admit: 2022-04-28 | Discharge: 2022-04-28 | Disposition: A | Discharge status: Home / Self Care | Payer: PRIVATE HEALTH INSURANCE | Source: Ambulatory Visit | Attending: Internal Medicine | Admitting: Internal Medicine

## 2022-04-28 DIAGNOSIS — E785 Hyperlipidemia, unspecified: Secondary | ICD-10-CM | POA: Insufficient documentation

## 2022-04-28 DIAGNOSIS — I10 Essential (primary) hypertension: Secondary | ICD-10-CM | POA: Insufficient documentation

## 2022-04-28 DIAGNOSIS — D582 Other hemoglobinopathies: Secondary | ICD-10-CM | POA: Insufficient documentation

## 2023-05-11 ENCOUNTER — Inpatient Hospital Stay: Payer: BC Managed Care – PPO | Attending: Oncology | Admitting: Oncology

## 2023-05-11 ENCOUNTER — Inpatient Hospital Stay: Payer: BC Managed Care – PPO

## 2023-05-11 ENCOUNTER — Encounter: Payer: Self-pay | Admitting: Oncology

## 2023-05-11 VITALS — BP 133/86 | HR 68 | Temp 97.0°F | Resp 18 | Wt 182.1 lb

## 2023-05-11 VITALS — BP 122/84 | HR 59

## 2023-05-11 DIAGNOSIS — D751 Secondary polycythemia: Secondary | ICD-10-CM

## 2023-05-11 DIAGNOSIS — I10 Essential (primary) hypertension: Secondary | ICD-10-CM | POA: Insufficient documentation

## 2023-05-11 DIAGNOSIS — K219 Gastro-esophageal reflux disease without esophagitis: Secondary | ICD-10-CM | POA: Insufficient documentation

## 2023-05-11 LAB — CBC WITH DIFFERENTIAL/PLATELET
Abs Immature Granulocytes: 0.04 10*3/uL (ref 0.00–0.07)
Basophils Absolute: 0 10*3/uL (ref 0.0–0.1)
Basophils Relative: 0 %
Eosinophils Absolute: 0.1 10*3/uL (ref 0.0–0.5)
Eosinophils Relative: 1 %
HCT: 52.7 % — ABNORMAL HIGH (ref 39.0–52.0)
Hemoglobin: 18.1 g/dL — ABNORMAL HIGH (ref 13.0–17.0)
Immature Granulocytes: 0 %
Lymphocytes Relative: 22 %
Lymphs Abs: 2.2 10*3/uL (ref 0.7–4.0)
MCH: 32.5 pg (ref 26.0–34.0)
MCHC: 34.3 g/dL (ref 30.0–36.0)
MCV: 94.6 fL (ref 80.0–100.0)
Monocytes Absolute: 0.7 10*3/uL (ref 0.1–1.0)
Monocytes Relative: 7 %
Neutro Abs: 7 10*3/uL (ref 1.7–7.7)
Neutrophils Relative %: 70 %
Platelets: 242 10*3/uL (ref 150–400)
RBC: 5.57 MIL/uL (ref 4.22–5.81)
RDW: 12.3 % (ref 11.5–15.5)
WBC: 10 10*3/uL (ref 4.0–10.5)
nRBC: 0 % (ref 0.0–0.2)

## 2023-05-12 ENCOUNTER — Encounter: Payer: Self-pay | Admitting: Oncology

## 2023-05-12 LAB — ERYTHROPOIETIN: Erythropoietin: 8 m[IU]/mL (ref 2.6–18.5)

## 2023-05-12 NOTE — Progress Notes (Signed)
 Hematology/Oncology Consult note [facility] Telephone:([phone] [phone]  Patient Care Team: Fernande Ophelia JINNY DOUGLAS, MD as PCP - General (Internal Medicine) Kassie Ozell SAUNDERS, MD (Urology)   Name of the patient: Jorge Brown  [id number]  [birth year]    Reason for referral-polycythemia   Referring physician-Dr. [name]   Date of visit: 05/12/23   History of presenting illness- patient is a 63 year old male with a past medical history significant for hypertension GERD and hypogonadism.  He is on testosterone  replacement therapy through his primary care doctor for his hypogonadism.  He has been referred for polycythemia.  He has not had any prior episodes of thromboembolism.  Denies any cardiovascular or strokelike events.Patient's hemoglobin was stable between 15-16 up until July 2023 and since then his hemoglobin has been around 18 with a hematocrit between 51-52.  He is on negative phenotype and donate his blood every 2 to 2-1/2 months.  At times he was told that he could not donate blood because of his high hematocrit.  He is unable to go down on his testosterone  dosing as it results in significant fatigue  ECOG PS- 0  Pain scale- 0   Review of systems- Review of Systems  Constitutional:  Negative for chills, fever, malaise/fatigue and weight loss.  HENT:  Negative for congestion, ear discharge and nosebleeds.   Eyes:  Negative for blurred vision.  Respiratory:  Negative for cough, hemoptysis, sputum production, shortness of breath and wheezing.   Cardiovascular:  Negative for chest pain, palpitations, orthopnea and claudication.  Gastrointestinal:  Negative for abdominal pain, blood in stool, constipation, diarrhea, heartburn, melena, nausea and vomiting.  Genitourinary:  Negative for dysuria, flank pain, frequency, hematuria and urgency.  Musculoskeletal:  Negative for back pain, joint pain and myalgias.  Skin:  Negative for rash.   Neurological:  Negative for dizziness, tingling, focal weakness, seizures, weakness and headaches.  Endo/Heme/Allergies:  Does not bruise/bleed easily.  Psychiatric/Behavioral:  Negative for depression and suicidal ideas. The patient does not have insomnia.     No Known Allergies  Patient Active Problem List   Diagnosis Date Noted   Polycythemia 05/11/2023   Cough due to ACE inhibitor 10/16/2018   Meniere disease 04/17/2018   Benign neoplasm of cecum    Polyp of sigmoid colon    GERD (gastroesophageal reflux disease) 03/08/2016   Testicular hypofunction 12/26/2014   Hypertension      Past Medical History:  Diagnosis Date   Cancer (HCC)    skin   GERD (gastroesophageal reflux disease)    Hypertension    Meniere disease    Testicular hypofunction      Past Surgical History:  Procedure Laterality Date   COLONOSCOPY WITH PROPOFOL  N/A 07/23/2016   Procedure: COLONOSCOPY WITH PROPOFOL ;  Surgeon: Rogelia Copping, MD;  Location: [facility];  Service: Endoscopy;  Laterality: N/A;   ESOPHAGOGASTRODUODENOSCOPY (EGD) WITH PROPOFOL  N/A 07/23/2016   Procedure: ESOPHAGOGASTRODUODENOSCOPY (EGD) WITH PROPOFOL ;  Surgeon: Rogelia Copping, MD;  Location: [facility];  Service: Endoscopy;  Laterality: N/A;   implanted testosterone  pellets  2012   MENISCUS REPAIR Right 2014   POLYPECTOMY  07/23/2016   Procedure: POLYPECTOMY;  Surgeon: Rogelia Copping, MD;  Location: [facility];  Service: Endoscopy;;    Social History   Socioeconomic History   Marital status: Married    Spouse name: Not on file   Number of children: Not on file   Years of education: Not on file   Highest education level:  Not on file  Occupational History   Not on file  Tobacco Use   Smoking status: Never   Smokeless tobacco: Never  Vaping Use   Vaping status: Never Used  Substance and Sexual Activity   Alcohol use: Yes    Alcohol/week: 8.0 - 9.0 standard drinks of alcohol    Types: 7 Glasses of  wine, 1 - 2 Cans of beer per week    Comment:     Drug use: No   Sexual activity: Yes  Other Topics Concern   Not on file  Social History Narrative   Not on file   Social Drivers of Health   Financial Resource Strain: Low Risk  (10/28/2022)   Received from [facility]   Overall Financial Resource Strain (CARDIA)    Difficulty of Paying Living Expenses: Not hard at all  Food Insecurity: No Food Insecurity (05/11/2023)   Hunger Vital Sign    Worried About Running Out of Food in the Last Year: Never true    Ran Out of Food in the Last Year: Never true  Transportation Needs: No Transportation Needs (05/11/2023)   PRAPARE - Administrator, Civil Service (Medical): No    Lack of Transportation (Non-Medical): No  Physical Activity: Not on file  Stress: Not on file  Social Connections: Not on file  Intimate Partner Violence: Not At Risk (05/11/2023)   Humiliation, Afraid, Rape, and Kick questionnaire    Fear of Current or Ex-Partner: No    Emotionally Abused: No    Physically Abused: No    Sexually Abused: No     Family History  Problem Relation Age of Onset   Dementia Mother    Heart disease Maternal Grandfather      Current Outpatient Medications:    hydrochlorothiazide  (HYDRODIURIL ) 12.5 MG tablet, Take by mouth., Disp: , Rfl:    omeprazole  (PRILOSEC) 20 MG capsule, Take by mouth., Disp: , Rfl:    rosuvastatin (CRESTOR) 5 MG tablet, Take by mouth., Disp: , Rfl:    testosterone  cypionate (DEPOTESTOSTERONE CYPIONATE) 200 MG/ML injection, SMARTSIG:Milliliter(s) IM, Disp: , Rfl:    triamterene-hydrochlorothiazide  (DYAZIDE) 37.5-25 MG capsule, Take 1 capsule by mouth daily., Disp: , Rfl:    losartan  (COZAAR ) 25 MG tablet, Take 0.5 tablets (12.5 mg total) by mouth daily., Disp: 45 tablet, Rfl: 0   losartan  (COZAAR ) 50 MG tablet, Take 1 tablet (50 mg total) by mouth daily., Disp: 90 tablet, Rfl: 0   pantoprazole  (PROTONIX ) 40 MG tablet, Take 1 tablet (40 mg  total) by mouth daily., Disp: 90 tablet, Rfl: 2   sildenafil (REVATIO) 20 MG tablet, Take 20 mg by mouth daily., Disp: , Rfl:    spironolactone  (ALDACTONE ) 25 MG tablet, Take 1 tablet (25 mg total) by mouth daily. (Patient not taking: Reported on 11/29/2019), Disp: 90 tablet, Rfl: 2   testosterone  enanthate (DELATESTRYL) 200 MG/ML injection, SMARTSIG:Milliliter(s) IM, Disp: , Rfl:    Physical exam:  Vitals:   05/11/23 1105  BP: 133/86  Pulse: 68  Resp: 18  Temp: (!) 97 F (36.1 C)  TempSrc: Tympanic  SpO2: 100%  Weight: 182 lb 1.6 oz (82.6 kg)   Physical Exam Cardiovascular:     Rate and Rhythm: Normal rate and regular rhythm.     Heart sounds: Normal heart sounds.  Pulmonary:     Effort: Pulmonary effort is normal.     Breath sounds: Normal breath sounds.  Abdominal:     General: Bowel sounds are normal.  Palpations: Abdomen is soft.  Skin:    General: Skin is warm and dry.  Neurological:     Mental Status: He is alert and oriented to person, place, and time.           Latest Ref Rng & Units 07/19/2019    4:34 PM  CMP  Glucose 65 - 99 mg/dL 95   BUN 6 - 24 mg/dL 17   Creatinine 9.23 - 1.27 mg/dL 9.09   Sodium 865 - 855 mmol/L 140   Potassium 3.5 - 5.2 mmol/L 3.7   Chloride 96 - 106 mmol/L 104   CO2 20 - 29 mmol/L 22   Calcium 8.7 - 10.2 mg/dL 9.6       Latest Ref Rng & Units 05/11/2023   11:49 AM  CBC  WBC 4.0 - 10.5 K/uL 10.0   Hemoglobin 13.0 - 17.0 g/dL 81.8   Hematocrit 60.9 - 52.0 % 52.7   Platelets 150 - 400 K/uL 242     No images are attached to the encounter.  No results found.  Assessment and plan- Patient is a 63 y.o. male referred for polycythemia likely secondary to testosterone  replacement therapy  Patient unable to lower his dose of testosterone  as results and significant fatigue.  My goal would be to keep his hematocrit less than 50 while he is on testosterone .  I would also like to do a polycythemia vera workup today including CBC with  differential CMP JAK2 with reflex to CALR, MPL and exon 12 mutation.  Discussed differences between polycythemia vera and secondary polycythemia.  If he is found to have polycythemia vera he will need to be started on Hydrea and goal of hematocrit would be less than 45 in that case.  Patient will receive phlebotomy today and come back for phlebotomy again next week.  He can continue to donate blood as he was doing before as well.  I will see him back in 3 months with CBC with differential   Thank you for this kind referral and the opportunity to participate in the care of this patient   Visit Diagnosis 1. Polycythemia     Dr. Annah Skene, MD, MPH [facility] at [facility] [phone] 05/12/2023

## 2023-05-19 LAB — CALR +MPL + E12-E15  (REFLEX)

## 2023-05-19 LAB — JAK2 V617F RFX CALR/MPL/E12-15

## 2023-05-25 ENCOUNTER — Inpatient Hospital Stay: Payer: BC Managed Care – PPO

## 2023-05-31 ENCOUNTER — Encounter: Payer: Self-pay | Admitting: Oncology

## 2023-05-31 ENCOUNTER — Inpatient Hospital Stay: Payer: BC Managed Care – PPO

## 2023-05-31 DIAGNOSIS — D751 Secondary polycythemia: Secondary | ICD-10-CM | POA: Diagnosis not present

## 2023-05-31 LAB — HEMOGLOBIN AND HEMATOCRIT (CANCER CENTER ONLY)
HCT: 52.2 % — ABNORMAL HIGH (ref 39.0–52.0)
Hemoglobin: 17.9 g/dL — ABNORMAL HIGH (ref 13.0–17.0)

## 2023-05-31 NOTE — Progress Notes (Signed)
 Patient tolatered phlebotomy well today, performed in RAC using 20g angiocath. 300 cc removed as ordered. HCT 52.2 today. No concerns voiced. Snack and po fluids offered. Stable at discharge.  Refused AVS .

## 2023-06-08 ENCOUNTER — Other Ambulatory Visit: Payer: BC Managed Care – PPO

## 2023-06-14 ENCOUNTER — Other Ambulatory Visit: Payer: BC Managed Care – PPO

## 2023-06-14 ENCOUNTER — Inpatient Hospital Stay: Payer: BC Managed Care – PPO | Attending: Oncology

## 2023-06-14 ENCOUNTER — Inpatient Hospital Stay: Payer: BC Managed Care – PPO

## 2023-06-14 VITALS — BP 134/90 | HR 77 | Temp 98.0°F | Resp 18

## 2023-06-14 DIAGNOSIS — D751 Secondary polycythemia: Secondary | ICD-10-CM

## 2023-06-14 LAB — HEMOGLOBIN AND HEMATOCRIT (CANCER CENTER ONLY)
HCT: 50.1 % (ref 39.0–52.0)
Hemoglobin: 17.2 g/dL — ABNORMAL HIGH (ref 13.0–17.0)

## 2023-06-28 ENCOUNTER — Inpatient Hospital Stay

## 2023-06-28 ENCOUNTER — Other Ambulatory Visit: Payer: BC Managed Care – PPO

## 2023-06-28 DIAGNOSIS — D751 Secondary polycythemia: Secondary | ICD-10-CM

## 2023-06-28 LAB — HEMOGLOBIN AND HEMATOCRIT (CANCER CENTER ONLY)
HCT: 50.1 % (ref 39.0–52.0)
Hemoglobin: 16.8 g/dL (ref 13.0–17.0)

## 2023-06-28 NOTE — Progress Notes (Signed)
 Patient tolatered phlebotomy well today, performed in RAC using 20g angiocath. 300cc removed as ordered. 50.1HCT today. No concerns voiced. Snack and po fluids offered. Stable at discharge. AVS given.

## 2023-07-06 ENCOUNTER — Inpatient Hospital Stay

## 2023-07-06 ENCOUNTER — Other Ambulatory Visit: Payer: BC Managed Care – PPO

## 2023-07-26 ENCOUNTER — Inpatient Hospital Stay: Attending: Oncology

## 2023-07-26 ENCOUNTER — Inpatient Hospital Stay: Payer: BC Managed Care – PPO | Attending: Oncology

## 2023-07-26 ENCOUNTER — Inpatient Hospital Stay: Payer: BC Managed Care – PPO

## 2023-07-26 ENCOUNTER — Inpatient Hospital Stay

## 2023-07-26 VITALS — BP 151/78 | HR 68 | Temp 97.1°F | Resp 18

## 2023-07-26 DIAGNOSIS — D751 Secondary polycythemia: Secondary | ICD-10-CM | POA: Insufficient documentation

## 2023-07-26 LAB — HEMOGLOBIN AND HEMATOCRIT (CANCER CENTER ONLY)
HCT: 50 % (ref 39.0–52.0)
Hemoglobin: 16.7 g/dL (ref 13.0–17.0)

## 2023-08-08 ENCOUNTER — Ambulatory Visit: Payer: BC Managed Care – PPO | Admitting: Oncology

## 2023-08-08 ENCOUNTER — Other Ambulatory Visit: Payer: BC Managed Care – PPO

## 2023-08-23 ENCOUNTER — Ambulatory Visit: Payer: BC Managed Care – PPO | Admitting: Oncology

## 2023-08-23 ENCOUNTER — Other Ambulatory Visit: Payer: BC Managed Care – PPO

## 2023-09-01 ENCOUNTER — Other Ambulatory Visit: Payer: Self-pay

## 2023-09-01 DIAGNOSIS — D751 Secondary polycythemia: Secondary | ICD-10-CM

## 2023-09-02 ENCOUNTER — Inpatient Hospital Stay

## 2023-09-02 ENCOUNTER — Inpatient Hospital Stay: Attending: Oncology

## 2023-09-02 ENCOUNTER — Encounter: Payer: Self-pay | Admitting: Oncology

## 2023-09-02 ENCOUNTER — Inpatient Hospital Stay: Admitting: Oncology

## 2023-09-02 VITALS — BP 118/77 | HR 72 | Temp 96.3°F | Resp 17 | Wt 185.0 lb

## 2023-09-02 VITALS — BP 128/94 | HR 77 | Resp 18

## 2023-09-02 DIAGNOSIS — D751 Secondary polycythemia: Secondary | ICD-10-CM | POA: Diagnosis not present

## 2023-09-02 LAB — HEMOGLOBIN AND HEMATOCRIT (CANCER CENTER ONLY)
HCT: 51.5 % (ref 39.0–52.0)
Hemoglobin: 16.9 g/dL (ref 13.0–17.0)

## 2023-09-02 NOTE — Progress Notes (Signed)
 Patient here for oncology follow-up appointment, expresses no complaints or concerns at this time.

## 2023-09-02 NOTE — Patient Instructions (Signed)

## 2023-09-02 NOTE — Progress Notes (Signed)
 Jorge Brown presents today for phlebotomy per MD orders. Phlebotomy procedure started at 1535 and ended at 1544. 300 mls removed. Patient tolerated procedure well. IV needle removed intact.

## 2023-09-02 NOTE — Progress Notes (Signed)
 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  He is on testosterone  replacement therapy through his primary care doctor for his hypogonadism.  He has been referred for polycythemia.  He has not had any prior episodes of thromboembolism.  Denies any cardiovascular or strokelike events.Patient's hemoglobin was stable between 15-16 up until July 2023 and since then his hemoglobin has been around 18 with a hematocrit between 51-52.  He is on negative phenotype and donate his blood every 2 to 2-1/2 months.  At times he was told that he could not donate blood because of his high hematocrit.  He is unable to go down on his testosterone  dosing as it results in significant fatigue   Results of workup for primary polycythemia including JAK2 mutation, CALR, MPL and exon 12 mutation negative.  Interval history-patient is doing well on his present dose of testosterone .  He is tolerating phlebotomy well so far.  ECOG PS- 1 Pain scale- 0   Review of systems- Review of Systems  Constitutional:  Negative for chills, fever, malaise/fatigue and weight loss.  HENT:  Negative for congestion, ear discharge and nosebleeds.   Eyes:  Negative for blurred vision.  Respiratory:  Negative for cough, hemoptysis, sputum production, shortness of breath and wheezing.   Cardiovascular:  Negative for chest pain,  palpitations, orthopnea and claudication.  Gastrointestinal:  Negative for abdominal pain, blood in stool, constipation, diarrhea, heartburn, melena, nausea and vomiting.  Genitourinary:  Negative for dysuria, flank pain, frequency, hematuria and urgency.  Musculoskeletal:  Negative for back pain, joint pain and myalgias.  Skin:  Negative for rash.  Neurological:  Negative for dizziness, tingling, focal weakness, seizures, weakness and headaches.  Endo/Heme/Allergies:  Does not bruise/bleed easily.  Psychiatric/Behavioral:  Negative for depression and suicidal ideas. The patient does not have insomnia.       No Known Allergies   Past Medical History:  Diagnosis Date   Cancer (HCC)    skin   GERD (gastroesophageal reflux disease)    Hypertension    Meniere disease    Testicular hypofunction      Past Surgical History:  Procedure Laterality Date   COLONOSCOPY WITH PROPOFOL  N/A 07/23/2016   Procedure: COLONOSCOPY WITH PROPOFOL ;  Surgeon: Marnee Sink, MD;  Location: [facility];  Service: Endoscopy;  Laterality: N/A;   ESOPHAGOGASTRODUODENOSCOPY (EGD) WITH PROPOFOL  N/A 07/23/2016   Procedure: ESOPHAGOGASTRODUODENOSCOPY (EGD) WITH PROPOFOL ;  Surgeon: Marnee Sink, MD;  Location: [facility];  Service: Endoscopy;  Laterality: N/A;   implanted testosterone  pellets  2012   MENISCUS REPAIR Right 2014   POLYPECTOMY  07/23/2016   Procedure: POLYPECTOMY;  Surgeon: Marnee Sink, MD;  Location: [facility];  Service: Endoscopy;;    Social History   Socioeconomic History   Marital status: Married    Spouse name: Not on file   Number of children: Not on file   Years of education: Not on file   Highest education level: Not on file  Occupational History  Not on file  Tobacco Use   Smoking status: Never   Smokeless tobacco: Never  Vaping Use   Vaping status: Never Used  Substance and Sexual Activity   Alcohol use: Yes    Alcohol/week: 8.0 - 9.0 standard drinks  of alcohol    Types: 7 Glasses of wine, 1 - 2 Cans of beer per week    Comment:     Drug use: No   Sexual activity: Yes  Other Topics Concern   Not on file  Social History Narrative   Not on file   Social Drivers of Health   Financial Resource Strain: Low Risk  (05/16/2023)   Received [facility]   Overall Financial Resource Strain (CARDIA)    Difficulty of Paying Living Expenses: Not hard at all  Food Insecurity: No Food Insecurity (05/16/2023)   Received from [facility]   Hunger Vital Sign    Worried About Running Out of Food in the Last Year: Never true    Ran Out of Food in the Last Year: Never true  Transportation Needs: No Transportation Needs (05/16/2023)   Received from [facility]    In the past 12 months, has lack of transportation kept you from medical appointments or from getting medications?: No    Lack of Transportation (Non-Medical): No  Physical Activity: Not on file  Stress: Not on file  Social Connections: Not on file  Intimate Partner Violence: Not At Risk (05/11/2023)   Humiliation, Afraid, Rape, and Kick questionnaire    Fear of Current or Ex-Partner: No    Emotionally Abused: No    Physically Abused: No    Sexually Abused: No    Family History  Problem Relation Age of Onset   Dementia Mother    Heart disease Maternal Grandfather      Current Outpatient Medications:    hydrochlorothiazide  (HYDRODIURIL ) 12.5 MG tablet, Take by mouth., Disp: , Rfl:    losartan  (COZAAR ) 25 MG tablet, Take 0.5 tablets (12.5 mg total) by mouth daily., Disp: 45 tablet, Rfl: 0   losartan  (COZAAR ) 50 MG tablet, Take 1 tablet (50 mg total) by mouth daily., Disp: 90 tablet, Rfl: 0   omeprazole  (PRILOSEC) 20 MG capsule, Take by mouth., Disp: , Rfl:    pantoprazole  (PROTONIX ) 40 MG tablet, Take 1 tablet (40 mg total) by mouth daily., Disp: 90 tablet, Rfl: 2   rosuvastatin (CRESTOR) 5 MG tablet,  Take by mouth., Disp: , Rfl:    sildenafil (REVATIO) 20 MG tablet, Take 20 mg by mouth daily., Disp: , Rfl:    testosterone  cypionate (DEPOTESTOSTERONE CYPIONATE) 200 MG/ML injection, SMARTSIG:Milliliter(s) IM, Disp: , Rfl:    testosterone  enanthate (DELATESTRYL) 200 MG/ML injection, SMARTSIG:Milliliter(s) IM, Disp: , Rfl:    triamterene-hydrochlorothiazide  (DYAZIDE) 37.5-25 MG capsule, Take 1 capsule by mouth daily., Disp: , Rfl:    spironolactone  (ALDACTONE ) 25 MG tablet, Take 1 tablet (25 mg total) by mouth daily. (Patient not taking: Reported on 09/02/2023), Disp: 90 tablet, Rfl: 2  Physical exam:  Vitals:   09/02/23 1455  BP: 118/77  Pulse: 72  Resp: 17  Temp: (!) 96.3 F (35.7 C)  TempSrc: Tympanic  SpO2: 99%  Weight: 185 lb (83.9 kg)   Physical Exam Cardiovascular:     Rate and Rhythm: Normal rate and regular rhythm.     Heart sounds: Normal heart sounds.  Pulmonary:     Effort: Pulmonary effort is normal.  Breath sounds: Normal breath sounds.  Skin:    General: Skin is warm and dry.  Neurological:     Mental Status: He is alert and oriented to person, place, and time.      I have personally reviewed labs listed below:    Latest Ref Rng & Units 07/19/2019    4:34 PM  CMP  Glucose 65 - 99 mg/dL 95   BUN 6 - 24 mg/dL 17   Creatinine 1.19 - 1.27 mg/dL 1.47   Sodium 829 - 562 mmol/L 140   Potassium 3.5 - 5.2 mmol/L 3.7   Chloride 96 - 106 mmol/L 104   CO2 20 - 29 mmol/L 22   Calcium 8.7 - 10.2 mg/dL 9.6       Latest Ref Rng & Units 09/02/2023    2:34 PM  CBC  Hemoglobin 13.0 - 17.0 g/dL 13.0   Hematocrit 86.5 - 52.0 % 51.5      Assessment and plan- Patient is a 63 y.o. 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  He is unable to go down any further on his testosterone  dose as it leads to significant fatigue.  Plan is to keep his hematocrit less than 50 while he is on  testosterone .  Hematocrit today is 51.5 and therefore we will proceed with phlebotomy today.  We will continue with monthly CBC and possible phlebotomy each month and I will see him back in 4 months.  Discussed the results of primary polycythemia testing which were negative including JAK2, CALR, MPL and exon 12 mutation.   Visit Diagnosis 1. Polycythemia, secondary      Dr. Seretha Dance, MD, MPH [facility] [phone] 09/02/2023 3:38 PM

## 2023-10-03 ENCOUNTER — Inpatient Hospital Stay

## 2023-10-03 ENCOUNTER — Inpatient Hospital Stay: Attending: Oncology

## 2023-10-03 DIAGNOSIS — D751 Secondary polycythemia: Secondary | ICD-10-CM | POA: Insufficient documentation

## 2023-10-03 LAB — CBC (CANCER CENTER ONLY)
HCT: 49.4 % (ref 39.0–52.0)
Hemoglobin: 16.4 g/dL (ref 13.0–17.0)
MCH: 30.7 pg (ref 26.0–34.0)
MCHC: 33.2 g/dL (ref 30.0–36.0)
MCV: 92.3 fL (ref 80.0–100.0)
Platelet Count: 242 10*3/uL (ref 150–400)
RBC: 5.35 MIL/uL (ref 4.22–5.81)
RDW: 13.5 % (ref 11.5–15.5)
WBC Count: 9.4 10*3/uL (ref 4.0–10.5)
nRBC: 0 % (ref 0.0–0.2)

## 2023-10-03 NOTE — Progress Notes (Signed)
 No phlebotomy needed toady.

## 2023-10-16 ENCOUNTER — Encounter: Payer: Self-pay | Admitting: Oncology

## 2023-10-17 ENCOUNTER — Telehealth: Payer: Self-pay

## 2023-10-17 NOTE — Telephone Encounter (Signed)
 Clinical Social Work was referred by medical provider for assessment of psychosocial needs.  CSW attempted to contact patient by phone.  Left voicemail with contact information and request for return call.

## 2023-11-02 ENCOUNTER — Inpatient Hospital Stay

## 2023-12-06 ENCOUNTER — Inpatient Hospital Stay

## 2023-12-06 ENCOUNTER — Inpatient Hospital Stay: Attending: Oncology

## 2023-12-06 DIAGNOSIS — D751 Secondary polycythemia: Secondary | ICD-10-CM | POA: Insufficient documentation

## 2023-12-06 LAB — CBC (CANCER CENTER ONLY)
HCT: 52.9 % — ABNORMAL HIGH (ref 39.0–52.0)
Hemoglobin: 17.8 g/dL — ABNORMAL HIGH (ref 13.0–17.0)
MCH: 31.4 pg (ref 26.0–34.0)
MCHC: 33.6 g/dL (ref 30.0–36.0)
MCV: 93.5 fL (ref 80.0–100.0)
Platelet Count: 250 K/uL (ref 150–400)
RBC: 5.66 MIL/uL (ref 4.22–5.81)
RDW: 13.4 % (ref 11.5–15.5)
WBC Count: 9.4 K/uL (ref 4.0–10.5)
nRBC: 0 % (ref 0.0–0.2)

## 2024-01-06 ENCOUNTER — Encounter

## 2024-01-06 ENCOUNTER — Ambulatory Visit: Admitting: Oncology

## 2024-01-06 ENCOUNTER — Other Ambulatory Visit

## 2024-01-13 ENCOUNTER — Inpatient Hospital Stay: Attending: Oncology

## 2024-01-13 ENCOUNTER — Encounter: Payer: Self-pay | Admitting: Oncology

## 2024-01-13 ENCOUNTER — Inpatient Hospital Stay

## 2024-01-13 ENCOUNTER — Inpatient Hospital Stay (HOSPITAL_BASED_OUTPATIENT_CLINIC_OR_DEPARTMENT_OTHER): Admitting: Oncology

## 2024-01-13 VITALS — BP 133/78 | HR 73 | Temp 97.8°F | Resp 19 | Ht 69.0 in | Wt 187.3 lb

## 2024-01-13 DIAGNOSIS — Z79890 Hormone replacement therapy (postmenopausal): Secondary | ICD-10-CM | POA: Diagnosis not present

## 2024-01-13 DIAGNOSIS — D751 Secondary polycythemia: Secondary | ICD-10-CM | POA: Insufficient documentation

## 2024-01-13 LAB — CBC (CANCER CENTER ONLY)
HCT: 50.8 % (ref 39.0–52.0)
Hemoglobin: 17.4 g/dL — ABNORMAL HIGH (ref 13.0–17.0)
MCH: 31.7 pg (ref 26.0–34.0)
MCHC: 34.3 g/dL (ref 30.0–36.0)
MCV: 92.5 fL (ref 80.0–100.0)
Platelet Count: 238 K/uL (ref 150–400)
RBC: 5.49 MIL/uL (ref 4.22–5.81)
RDW: 13.6 % (ref 11.5–15.5)
WBC Count: 12.3 K/uL — ABNORMAL HIGH (ref 4.0–10.5)
nRBC: 0 % (ref 0.0–0.2)

## 2024-01-13 NOTE — Progress Notes (Signed)
 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  He is on testosterone  replacement therapy through his primary care doctor for his hypogonadism.  He has been referred for polycythemia.  He has not had any prior episodes of thromboembolism.  Denies any cardiovascular or strokelike events.Patient's hemoglobin was stable between 15-16 up until July 2023 and since then his hemoglobin has been around 18 with a hematocrit between 51-52.  He is on negative phenotype and donate his blood every 2 to 2-1/2 months.  At times he was told that he could not donate blood because of his high hematocrit.  He is unable to go down on his testosterone  dosing as it results in significant fatigue    Results of workup for primary polycythemia including JAK2 mutation, CALR, MPL and exon 12 mutation negative.  Interval history- he is doing well presently and denies any complaints at this time.  Testosterone  doses have been stable and he reports no significant fatigue  ECOG PS- 1 Pain scale- 0   Review of systems- Review of Systems  Constitutional:  Negative for chills, fever, malaise/fatigue and weight loss.  HENT:  Negative for congestion, ear discharge and nosebleeds.   Eyes:  Negative for blurred vision.  Respiratory:  Negative for cough, hemoptysis, sputum production, shortness of breath and wheezing.    Cardiovascular:  Negative for chest pain, palpitations, orthopnea and claudication.  Gastrointestinal:  Negative for abdominal pain, blood in stool, constipation, diarrhea, heartburn, melena, nausea and vomiting.  Genitourinary:  Negative for dysuria, flank pain, frequency, hematuria and urgency.  Musculoskeletal:  Negative for back pain, joint pain and myalgias.  Skin:  Negative for rash.  Neurological:  Negative for dizziness, tingling, focal weakness, seizures, weakness and headaches.  Endo/Heme/Allergies:  Does not bruise/bleed easily.  Psychiatric/Behavioral:  Negative for depression and suicidal ideas. The patient does not have insomnia.       No Known Allergies   Past Medical History:  Diagnosis Date   Cancer (HCC)    skin   GERD (gastroesophageal reflux disease)    Hypertension    Meniere disease    Testicular hypofunction      Past Surgical History:  Procedure Laterality Date   COLONOSCOPY WITH PROPOFOL  N/A 07/23/2016   Procedure: COLONOSCOPY WITH PROPOFOL ;  Surgeon: Rogelia Copping, MD;  Location: [facility];  Service: Endoscopy;  Laterality: N/A;   ESOPHAGOGASTRODUODENOSCOPY (EGD) WITH PROPOFOL  N/A 07/23/2016   Procedure: ESOPHAGOGASTRODUODENOSCOPY (EGD) WITH PROPOFOL ;  Surgeon: Rogelia Copping, MD;  Location: [facility];  Service: Endoscopy;  Laterality: N/A;   implanted testosterone  pellets  2012   MENISCUS REPAIR Right 2014   POLYPECTOMY  07/23/2016   Procedure: POLYPECTOMY;  Surgeon: Rogelia Copping, MD;  Location: [facility];  Service: Endoscopy;;    Social History   Socioeconomic History   Marital status: Married    Spouse name: Not on file   Number of children: Not on file   Years of education: Not on file  Highest education level: Not on file  Occupational History   Not on file  Tobacco Use   Smoking status: Never   Smokeless tobacco: Never  Vaping Use   Vaping status: Never Used  Substance and Sexual Activity   Alcohol use: Yes     Alcohol/week: 8.0 - 9.0 standard drinks of alcohol    Types: 7 Glasses of wine, 1 - 2 Cans of beer per week    Comment:     Drug use: No   Sexual activity: Yes  Other Topics Concern   Not on file  Social History Narrative   Not on file   Social Drivers of Health   Financial Resource Strain: Low Risk  (01/11/2024)   Received from [facility]   Overall Financial Resource Strain (CARDIA)    Difficulty of Paying Living Expenses: Not very hard  Food Insecurity: No Food Insecurity (01/11/2024)   Received from [facility]   Hunger Vital Sign    Within the past 12 months, you worried that your food would run out before you got the money to buy more.: Never true    Within the past 12 months, the food you bought just didn't last and you didn't have money to get more.: Never true  Transportation Needs: No Transportation Needs (01/11/2024)   Received from [facility] - Transportation    In the past 12 months, has lack of transportation kept you from medical appointments or from getting medications?: No    Lack of Transportation (Non-Medical): No  Physical Activity: Not on file  Stress: Not on file  Social Connections: Not on file  Intimate Partner Violence: Not At Risk (05/11/2023)   Humiliation, Afraid, Rape, and Kick questionnaire    Fear of Current or Ex-Partner: No    Emotionally Abused: No    Physically Abused: No    Sexually Abused: No    Family History  Problem Relation Age of Onset   Dementia Mother    Heart disease Maternal Grandfather      Current Outpatient Medications:    hydrochlorothiazide  (HYDRODIURIL ) 12.5 MG tablet, Take by mouth., Disp: , Rfl:    losartan  (COZAAR ) 25 MG tablet, Take 0.5 tablets (12.5 mg total) by mouth daily., Disp: 45 tablet, Rfl: 0   losartan  (COZAAR ) 50 MG tablet, Take 1 tablet (50 mg total) by mouth daily., Disp: 90 tablet, Rfl: 0   omeprazole  (PRILOSEC) 20 MG capsule, Take by  mouth., Disp: , Rfl:    pantoprazole  (PROTONIX ) 40 MG tablet, Take 1 tablet (40 mg total) by mouth daily., Disp: 90 tablet, Rfl: 2   rosuvastatin (CRESTOR) 5 MG tablet, Take by mouth., Disp: , Rfl:    sildenafil (REVATIO) 20 MG tablet, Take 20 mg by mouth daily., Disp: , Rfl:    spironolactone  (ALDACTONE ) 25 MG tablet, Take 1 tablet (25 mg total) by mouth daily. (Patient not taking: Reported on 09/02/2023), Disp: 90 tablet, Rfl: 2   testosterone  cypionate (DEPOTESTOSTERONE CYPIONATE) 200 MG/ML injection, SMARTSIG:Milliliter(s) IM, Disp: , Rfl:    testosterone  enanthate (DELATESTRYL) 200 MG/ML injection, SMARTSIG:Milliliter(s) IM, Disp: , Rfl:    triamterene-hydrochlorothiazide  (DYAZIDE) 37.5-25 MG capsule, Take 1 capsule by mouth daily., Disp: , Rfl:   Physical exam:  Vitals:   01/13/24 1502  BP: 133/78  Pulse: 73  Resp: 19  Temp: 97.8 F (36.6 C)  TempSrc: Tympanic  SpO2: 98%  Weight: 187 lb 4.8 oz (85 kg)  Height: 5' 9 (1.753  m)   Physical Exam Cardiovascular:     Rate and Rhythm: Normal rate and regular rhythm.     Heart sounds: Normal heart sounds.  Pulmonary:     Effort: Pulmonary effort is normal.     Breath sounds: Normal breath sounds.  Abdominal:     General: Bowel sounds are normal.     Palpations: Abdomen is soft.  Skin:    General: Skin is warm and dry.  Neurological:     Mental Status: He is alert and oriented to person, place, and time.      I have personally reviewed labs listed below:    Latest Ref Rng & Units 07/19/2019    4:34 PM  CMP  Glucose 65 - 99 mg/dL 95   BUN 6 - 24 mg/dL 17   Creatinine 9.23 - 1.27 mg/dL 9.09   Sodium 865 - 855 mmol/L 140   Potassium 3.5 - 5.2 mmol/L 3.7   Chloride 96 - 106 mmol/L 104   CO2 20 - 29 mmol/L 22   Calcium 8.7 - 10.2 mg/dL 9.6       Latest Ref Rng & Units 01/13/2024    2:42 PM  CBC  WBC 4.0 - 10.5 K/uL 12.3   Hemoglobin 13.0 - 17.0 g/dL 82.5   Hematocrit 60.9 - 52.0 % 50.8   Platelets 150 - 400 K/uL 238        Assessment and plan- Patient is a 63 y.o. 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  Target hematocrit is to keep it less than 50.  It is 50.8 today.  Patient does find it prohibitively expensive to come for periodic phlebotomy here at the cancer center.  I would be okay if he needs to donate blood to [employer] every month and we will draft a letter for stating that he can go for blood donations every month.  CBC in 3 and 6 months and I will see him back in 6 months   Visit Diagnosis 1. Polycythemia, secondary      Dr. Annah Skene, MD, MPH [facility] at [facility] [phone] 01/13/2024 2:56 PM

## 2024-01-13 NOTE — Progress Notes (Signed)
 No phlebotomy today per pt. Request.

## 2024-01-17 ENCOUNTER — Telehealth: Payer: Self-pay

## 2024-01-17 NOTE — Telephone Encounter (Signed)
 My chart message sent with letter included.  Original letter mailed to the address on file.

## 2024-04-13 ENCOUNTER — Inpatient Hospital Stay

## 2024-04-13 ENCOUNTER — Telehealth: Payer: Self-pay | Admitting: Oncology

## 2024-04-13 NOTE — Telephone Encounter (Signed)
 Pt spouse called to cancel lab/phleb appt today as pt will not make this appt. Spouse stated pt will call back to r/s. Appts canceled and noted

## 2024-07-13 ENCOUNTER — Ambulatory Visit: Admitting: Oncology

## 2024-07-13 ENCOUNTER — Other Ambulatory Visit

## 2024-07-13 ENCOUNTER — Encounter
